# Patient Record
Sex: Male | Born: 1940 | Race: White | Hispanic: No | Marital: Married | Smoking: Former smoker
Health system: Southern US, Community
[De-identification: ages and names within clinical notes are randomized; demographics above are authoritative.]

## PROBLEM LIST (undated history)

## (undated) DIAGNOSIS — I10 Essential (primary) hypertension: Secondary | ICD-10-CM

## (undated) DIAGNOSIS — M1990 Unspecified osteoarthritis, unspecified site: Secondary | ICD-10-CM

## (undated) DIAGNOSIS — K219 Gastro-esophageal reflux disease without esophagitis: Secondary | ICD-10-CM

## (undated) DIAGNOSIS — G4730 Sleep apnea, unspecified: Secondary | ICD-10-CM

## (undated) HISTORY — PX: BILATERAL CARPAL TUNNEL RELEASE: SHX6508

## (undated) HISTORY — PX: COLONOSCOPY: SHX174

---

## 2002-06-26 ENCOUNTER — Encounter: Admission: RE | Admit: 2002-06-26 | Discharge: 2002-06-26 | Payer: Self-pay

## 2002-11-17 ENCOUNTER — Ambulatory Visit (HOSPITAL_BASED_OUTPATIENT_CLINIC_OR_DEPARTMENT_OTHER): Admission: RE | Admit: 2002-11-17 | Discharge: 2002-11-17 | Payer: Self-pay

## 2002-12-26 ENCOUNTER — Encounter: Admission: RE | Admit: 2002-12-26 | Discharge: 2002-12-26 | Payer: Self-pay

## 2014-10-14 DIAGNOSIS — Z1389 Encounter for screening for other disorder: Secondary | ICD-10-CM | POA: Diagnosis not present

## 2014-10-14 DIAGNOSIS — I10 Essential (primary) hypertension: Secondary | ICD-10-CM | POA: Diagnosis not present

## 2014-10-14 DIAGNOSIS — M109 Gout, unspecified: Secondary | ICD-10-CM | POA: Diagnosis not present

## 2014-10-14 DIAGNOSIS — N400 Enlarged prostate without lower urinary tract symptoms: Secondary | ICD-10-CM | POA: Diagnosis not present

## 2014-10-14 DIAGNOSIS — R7309 Other abnormal glucose: Secondary | ICD-10-CM | POA: Diagnosis not present

## 2014-10-14 DIAGNOSIS — Z79899 Other long term (current) drug therapy: Secondary | ICD-10-CM | POA: Diagnosis not present

## 2014-10-14 DIAGNOSIS — E782 Mixed hyperlipidemia: Secondary | ICD-10-CM | POA: Diagnosis not present

## 2015-05-14 DIAGNOSIS — M25561 Pain in right knee: Secondary | ICD-10-CM | POA: Diagnosis not present

## 2015-05-14 DIAGNOSIS — M1711 Unilateral primary osteoarthritis, right knee: Secondary | ICD-10-CM | POA: Diagnosis not present

## 2015-05-18 DIAGNOSIS — R7303 Prediabetes: Secondary | ICD-10-CM | POA: Diagnosis not present

## 2015-05-18 DIAGNOSIS — Z9181 History of falling: Secondary | ICD-10-CM | POA: Diagnosis not present

## 2015-05-18 DIAGNOSIS — M109 Gout, unspecified: Secondary | ICD-10-CM | POA: Diagnosis not present

## 2015-05-18 DIAGNOSIS — Z125 Encounter for screening for malignant neoplasm of prostate: Secondary | ICD-10-CM | POA: Diagnosis not present

## 2015-05-18 DIAGNOSIS — Z23 Encounter for immunization: Secondary | ICD-10-CM | POA: Diagnosis not present

## 2015-05-18 DIAGNOSIS — E782 Mixed hyperlipidemia: Secondary | ICD-10-CM | POA: Diagnosis not present

## 2015-05-18 DIAGNOSIS — Z139 Encounter for screening, unspecified: Secondary | ICD-10-CM | POA: Diagnosis not present

## 2015-05-18 DIAGNOSIS — I10 Essential (primary) hypertension: Secondary | ICD-10-CM | POA: Diagnosis not present

## 2015-05-18 DIAGNOSIS — M179 Osteoarthritis of knee, unspecified: Secondary | ICD-10-CM | POA: Diagnosis not present

## 2015-06-08 ENCOUNTER — Other Ambulatory Visit (HOSPITAL_COMMUNITY): Payer: Self-pay | Admitting: *Deleted

## 2015-06-08 NOTE — Patient Instructions (Addendum)
Albert Hart.  06/08/2015   Your procedure is scheduled MI:WOEHOZY 06-22-15  Report to [facility]  Entrance take [facility]  elevators to 3rd floor to  [place] at 1055 PM  Call this number if you have problems the morning of surgery [phone]   Remember: ONLY 1 PERSON MAY GO WITH YOU TO SHORT STAY TO GET  READY MORNING OF YOUR SURGERY.  Do not eat food or drink liquids :After Midnight.     Take these medicines the morning of surgery with A SIP OF WATER:  Carvedilol (Coreg)  DO NOT TAKE ANY DIABETIC MEDICATIONS DAY OF YOUR SURGERY!                               You may not have any metal on your body including hair pins and              piercings  Do not wear jewelry, make-up, lotions, powders or perfumes, deodorant             Do not wear nail polish.  Do not shave  48 hours prior to surgery.              Men may shave face and neck.   Do not bring valuables to the hospital. [place].  Contacts, dentures or bridgework may not be worn into surgery.  Leave suitcase in the car. After surgery it may be brought to your room.     Patients discharged the day of surgery will not be allowed to drive home.  Name and phone number of your driver:  Special Instructions: N/A              Please read over the following fact sheets you were given: _____________________________________________________________________             [facility] - Preparing for Surgery Before surgery, you can play an important role.  Because skin is not sterile, your skin needs to be as free of germs as possible.  You can reduce the number of germs on your skin by washing with CHG (chlorahexidine gluconate) soap before surgery.  CHG is an antiseptic cleaner which kills germs and bonds with the skin to continue killing germs even after washing. Please DO NOT use if you have an allergy to CHG or antibacterial soaps.  If your  skin becomes reddened/irritated stop using the CHG and inform your nurse when you arrive at Short Stay. Do not shave (including legs and underarms) for at least 48 hours prior to the first CHG shower.  You may shave your face/neck. Please follow these instructions carefully:  1.  Shower with CHG Soap the night before surgery and the  morning of Surgery.  2.  If you choose to wash your hair, wash your hair first as usual with your  normal  shampoo.  3.  After you shampoo, rinse your hair and body thoroughly to remove the  shampoo.                           4.  Use CHG as you would any other liquid soap.  You can apply chg directly  to the skin  and wash                       Gently with a scrungie or clean washcloth.  5.  Apply the CHG Soap to your body ONLY FROM THE NECK DOWN.   Do not use on face/ open                           Wound or open sores. Avoid contact with eyes, ears mouth and genitals (private parts).                       Wash face,  Genitals (private parts) with your normal soap.             6.  Wash thoroughly, paying special attention to the area where your surgery  will be performed.  7.  Thoroughly rinse your body with warm water from the neck down.  8.  DO NOT shower/wash with your normal soap after using and rinsing off  the CHG Soap.                9.  Pat yourself dry with a clean towel.            10.  Wear clean pajamas.            11.  Place clean sheets on your bed the night of your first shower and do not  sleep with pets. Day of Surgery : Do not apply any lotions/deodorants the morning of surgery.  Please wear clean clothes to the hospital/surgery center.  FAILURE TO FOLLOW THESE INSTRUCTIONS MAY RESULT IN THE CANCELLATION OF YOUR SURGERY PATIENT SIGNATURE_________________________________  NURSE SIGNATURE__________________________________  ________________________________________________________________________   Adam Phenix  An incentive spirometer  is a tool that can help keep your lungs clear and active. This tool measures how well you are filling your lungs with each breath. Taking long deep breaths may help reverse or decrease the chance of developing breathing (pulmonary) problems (especially infection) following:  A long period of time when you are unable to move or be active. BEFORE THE PROCEDURE   If the spirometer includes an indicator to show your best effort, your nurse or respiratory therapist will set it to a desired goal.  If possible, sit up straight or lean slightly forward. Try not to slouch.  Hold the incentive spirometer in an upright position. INSTRUCTIONS FOR USE   Sit on the edge of your bed if possible, or sit up as far as you can in bed or on a chair.  Hold the incentive spirometer in an upright position.  Breathe out normally.  Place the mouthpiece in your mouth and seal your lips tightly around it.  Breathe in slowly and as deeply as possible, raising the piston or the ball toward the top of the column.  Hold your breath for 3-5 seconds or for as long as possible. Allow the piston or ball to fall to the bottom of the column.  Remove the mouthpiece from your mouth and breathe out normally.  Rest for a few seconds and repeat Steps 1 through 7 at least 10 times every 1-2 hours when you are awake. Take your time and take a few normal breaths between deep breaths.  The spirometer may include an indicator to show your best effort. Use the indicator as a goal to work toward during each repetition.  After each set of 10  deep breaths, practice coughing to be sure your lungs are clear. If you have an incision (the cut made at the time of surgery), support your incision when coughing by placing a pillow or rolled up towels firmly against it. Once you are able to get out of bed, walk around indoors and cough well. You may stop using the incentive spirometer when instructed by your caregiver.  RISKS AND  COMPLICATIONS  Take your time so you do not get dizzy or light-headed.  If you are in pain, you may need to take or ask for pain medication before doing incentive spirometry. It is harder to take a deep breath if you are having pain. AFTER USE  Rest and breathe slowly and easily.  It can be helpful to keep track of a log of your progress. Your caregiver can provide you with a simple table to help with this. If you are using the spirometer at home, follow these instructions: [place] IF:   You are having difficultly using the spirometer.  You have trouble using the spirometer as often as instructed.  Your pain medication is not giving enough relief while using the spirometer.  You develop fever of 100.5 F (38.1 C) or higher. SEEK IMMEDIATE MEDICAL CARE IF:   You cough up bloody sputum that had not been present before.  You develop fever of 102 F (38.9 C) or greater.  You develop worsening pain at or near the incision site. MAKE SURE YOU:   Understand these instructions.  Will watch your condition.  Will get help right away if you are not doing well or get worse. Document Released: 12/11/2006 Document Revised: 10/23/2011 Document Reviewed: 02/11/2007 Exit[employer] Patient Information 2014 [employer], [place].   ________________________________________________________________________  WHAT IS A BLOOD TRANSFUSION? Blood Transfusion Information  A transfusion is the replacement of blood or some of its parts. Blood is made up of multiple cells which provide different functions.  Red blood cells carry oxygen and are used for blood loss replacement.  White blood cells fight against infection.  Platelets control bleeding.  Plasma helps clot blood.  Other blood products are available for specialized needs, such as hemophilia or other clotting disorders. BEFORE THE TRANSFUSION  Who gives blood for transfusions?   Healthy volunteers who are fully evaluated to make sure  their blood is safe. This is blood bank blood. Transfusion therapy is the safest it has ever been in the practice of medicine. Before blood is taken from a donor, a complete history is taken to make sure that person has no history of diseases nor engages in risky social behavior (examples are intravenous drug use or sexual activity with multiple partners). The donor's travel history is screened to minimize risk of transmitting infections, such as malaria. The donated blood is tested for signs of infectious diseases, such as HIV and hepatitis. The blood is then tested to be sure it is compatible with you in order to minimize the chance of a transfusion reaction. If you or a relative donates blood, this is often done in anticipation of surgery and is not appropriate for emergency situations. It takes many days to process the donated blood. RISKS AND COMPLICATIONS Although transfusion therapy is very safe and saves many lives, the main dangers of transfusion include:   Getting an infectious disease.  Developing a transfusion reaction. This is an allergic reaction to something in the blood you were given. Every precaution is taken to prevent this. The decision to have a blood transfusion  has been considered carefully by your caregiver before blood is given. Blood is not given unless the benefits outweigh the risks. AFTER THE TRANSFUSION  Right after receiving a blood transfusion, you will usually feel much better and more energetic. This is especially true if your red blood cells have gotten low (anemic). The transfusion raises the level of the red blood cells which carry oxygen, and this usually causes an energy increase.  The nurse administering the transfusion will monitor you carefully for complications. HOME CARE INSTRUCTIONS  No special instructions are needed after a transfusion. You may find your energy is better. Speak with your caregiver about any limitations on activity for underlying diseases  you may have. SEEK MEDICAL CARE IF:   Your condition is not improving after your transfusion.  You develop redness or irritation at the intravenous (IV) site. SEEK IMMEDIATE MEDICAL CARE IF:  Any of the following symptoms occur over the next 12 hours:  Shaking chills.  You have a temperature by mouth above 102 F (38.9 C), not controlled by medicine.  Chest, back, or muscle pain.  People around you feel you are not acting correctly or are confused.  Shortness of breath or difficulty breathing.  Dizziness and fainting.  You get a rash or develop hives.  You have a decrease in urine output.  Your urine turns a dark color or changes to pink, red, or brown. Any of the following symptoms occur over the next 10 days:  You have a temperature by mouth above 102 F (38.9 C), not controlled by medicine.  Shortness of breath.  Weakness after normal activity.  The white part of the eye turns yellow (jaundice).  You have a decrease in the amount of urine or are urinating less often.  Your urine turns a dark color or changes to pink, red, or brown. Document Released: 07/28/2000 Document Revised: 10/23/2011 Document Reviewed: 03/16/2008 [facility] Patient Information 2014 [place], [place].  _______________________________________________________________________

## 2015-06-08 NOTE — Progress Notes (Signed)
Medical clearance note nathan conroy pac on chart for 06-22-15 surgery  ekg 08-17-14 ekg dr Lisbeth Ply on chart

## 2015-06-10 ENCOUNTER — Encounter (HOSPITAL_COMMUNITY): Payer: Self-pay

## 2015-06-10 ENCOUNTER — Encounter (HOSPITAL_COMMUNITY)
Admission: RE | Admit: 2015-06-10 | Discharge: 2015-06-10 | Disposition: A | Discharge status: Home / Self Care | Payer: Commercial Managed Care - HMO | Source: Ambulatory Visit | Attending: Orthopedic Surgery | Admitting: Orthopedic Surgery

## 2015-06-10 DIAGNOSIS — M179 Osteoarthritis of knee, unspecified: Secondary | ICD-10-CM | POA: Diagnosis not present

## 2015-06-10 DIAGNOSIS — Z01818 Encounter for other preprocedural examination: Secondary | ICD-10-CM | POA: Diagnosis not present

## 2015-06-10 HISTORY — DX: Gastro-esophageal reflux disease without esophagitis: K21.9

## 2015-06-10 HISTORY — DX: Essential (primary) hypertension: I10

## 2015-06-10 HISTORY — DX: Sleep apnea, unspecified: G47.30

## 2015-06-10 HISTORY — DX: Unspecified osteoarthritis, unspecified site: M19.90

## 2015-06-10 LAB — TYPE AND SCREEN
ABO/RH(D): A NEG
Antibody Screen: NEGATIVE

## 2015-06-10 LAB — ABO/RH: ABO/RH(D): A NEG

## 2015-06-10 LAB — URINALYSIS, ROUTINE W REFLEX MICROSCOPIC
Bilirubin Urine: NEGATIVE
GLUCOSE, UA: NEGATIVE mg/dL
HGB URINE DIPSTICK: NEGATIVE
Ketones, ur: NEGATIVE mg/dL
Nitrite: NEGATIVE
Protein, ur: NEGATIVE mg/dL
SPECIFIC GRAVITY, URINE: 1.018 (ref 1.005–1.030)
UROBILINOGEN UA: 1 mg/dL (ref 0.0–1.0)
pH: 6 (ref 5.0–8.0)

## 2015-06-10 LAB — URINE MICROSCOPIC-ADD ON

## 2015-06-10 LAB — CBC
HCT: 43.1 % (ref 39.0–52.0)
Hemoglobin: 14.4 g/dL (ref 13.0–17.0)
MCH: 33 pg (ref 26.0–34.0)
MCHC: 33.4 g/dL (ref 30.0–36.0)
MCV: 98.6 fL (ref 78.0–100.0)
PLATELETS: 183 10*3/uL (ref 150–400)
RBC: 4.37 MIL/uL (ref 4.22–5.81)
RDW: 12.9 % (ref 11.5–15.5)
WBC: 7.2 10*3/uL (ref 4.0–10.5)

## 2015-06-10 LAB — BASIC METABOLIC PANEL
Anion gap: 8 (ref 5–15)
BUN: 18 mg/dL (ref 6–20)
CHLORIDE: 104 mmol/L (ref 101–111)
CO2: 28 mmol/L (ref 22–32)
CREATININE: 0.89 mg/dL (ref 0.61–1.24)
Calcium: 9.4 mg/dL (ref 8.9–10.3)
Glucose, Bld: 132 mg/dL — ABNORMAL HIGH (ref 65–99)
POTASSIUM: 4 mmol/L (ref 3.5–5.1)
SODIUM: 140 mmol/L (ref 135–145)

## 2015-06-10 LAB — PROTIME-INR
INR: 1.01 (ref 0.00–1.49)
PROTHROMBIN TIME: 13.5 s (ref 11.6–15.2)

## 2015-06-10 LAB — SURGICAL PCR SCREEN
MRSA, PCR: NEGATIVE
Staphylococcus aureus: NEGATIVE

## 2015-06-10 LAB — APTT: APTT: 24 s (ref 24–37)

## 2015-06-10 NOTE — H&P (Signed)
TOTAL KNEE ADMISSION H&P  Patient is being admitted for right total knee arthroplasty.  Subjective:  Chief Complaint:      Right knee primary OA / pain  HPI: Albert Hart., 74 y.o. male, has a history of pain and functional disability in the right knee due to arthritis and has failed non-surgical conservative treatments for greater than 12 weeks to include NSAID's and/or analgesics, corticosteriod injections, viscosupplementation injections and activity modification.  Onset of symptoms was gradual, starting 3+ years ago with gradually worsening course since that time. The patient noted no past surgery on the right knee(s).  Patient currently rates pain in the right knee(s) at 6 out of 10 with activity. Patient has worsening of pain with activity and weight bearing, pain that interferes with activities of daily living, pain with passive range of motion, crepitus and joint swelling.  Patient has evidence of periarticular osteophytes and joint space narrowing by imaging studies.  There is no active infection.  Risks, benefits and expectations were discussed with the patient.  Risks including but not limited to the risk of anesthesia, blood clots, nerve damage, blood vessel damage, failure of the prosthesis, infection and up to and including death.  Patient understand the risks, benefits and expectations and wishes to proceed with surgery.   PCP: Albert Sake, MD  D/C Plans:      Home with HHPT  Post-op Meds:       No Rx given  Tranexamic Acid:      To be given - IV  Decadron:      Is to be given  FYI:     ASA post-op  Norco post-op  CPAP as needed  ?? - Observation admission ??    Past Medical History  Diagnosis Date  . Hypertension   . GERD (gastroesophageal reflux disease)   . Arthritis   . Sleep apnea     uses CPAP    Past Surgical History  Procedure Laterality Date  . Colonoscopy    . Bilateral carpal tunnel release      No prescriptions prior to admission   No Known  Allergies   Social History  Substance Use Topics  . Smoking status: Former Smoker -- 40 years    Types: Pipe, Cigars    Quit date: 06/03/2000  . Smokeless tobacco: Not on file  . Alcohol Use: Yes     Comment: beer occassionally    No family history on file.   Review of Systems  Constitutional: Negative.   HENT: Negative.   Eyes: Negative.   Respiratory: Negative.   Cardiovascular: Negative.   Gastrointestinal: Positive for heartburn.  Genitourinary: Negative.   Musculoskeletal: Positive for joint pain.  Skin: Negative.   Neurological: Negative.   Endo/Heme/Allergies: Negative.   Psychiatric/Behavioral: Negative.     Objective:  Physical Exam  Constitutional: He is oriented to person, place, and time. He appears well-developed and well-nourished.  HENT:  Head: Normocephalic.  Mouth/Throat: He has dentures.  Eyes: Pupils are equal, round, and reactive to light.  Neck: Neck supple. No JVD present. No tracheal deviation present. No thyromegaly present.  Cardiovascular: Normal rate, regular rhythm, normal heart sounds and intact distal pulses.   Respiratory: Effort normal and breath sounds normal. No stridor. No respiratory distress. He has no wheezes.  GI: Soft. There is no tenderness. There is no guarding.  Musculoskeletal:       Right knee: He exhibits decreased range of motion, swelling and bony tenderness. He exhibits no ecchymosis,  no deformity, no laceration and no erythema. Tenderness found.  Lymphadenopathy:    He has no cervical adenopathy.  Neurological: He is alert and oriented to person, place, and time.  Skin: Skin is warm and dry.  Psychiatric: He has a normal mood and affect.      Imaging Review Plain radiographs demonstrate severe degenerative joint disease of the right knee(s).  The bone quality appears to be good for age and reported activity level.  Assessment/Plan:  End stage arthritis, right knee   The patient history, physical examination,  clinical judgment of the provider and imaging studies are consistent with end stage degenerative joint disease of the right knee(s) and total knee arthroplasty is deemed medically necessary. The treatment options including medical management, injection therapy arthroscopy and arthroplasty were discussed at length. The risks and benefits of total knee arthroplasty were presented and reviewed. The risks due to aseptic loosening, infection, stiffness, patella tracking problems, thromboembolic complications and other imponderables were discussed. The patient acknowledged the explanation, agreed to proceed with the plan and consent was signed. Patient is being admitted for inpatient treatment for surgery, pain control, PT, OT, prophylactic antibiotics, VTE prophylaxis, progressive ambulation and ADL's and discharge planning. The patient is planning to be discharged home with home health services.     [place] Charley Miske   PA-C  06/10/2015, 9:52 AM

## 2015-06-21 MED ORDER — DEXTROSE 5 % IV SOLN
3.0000 g | INTRAVENOUS | Status: AC
  Administered 2015-06-22: 3 g via INTRAVENOUS
  Filled 2015-06-21: qty 3000

## 2015-06-22 ENCOUNTER — Inpatient Hospital Stay (HOSPITAL_COMMUNITY): Payer: Commercial Managed Care - HMO | Admitting: Anesthesiology

## 2015-06-22 ENCOUNTER — Inpatient Hospital Stay (HOSPITAL_COMMUNITY)
Admission: RE | Admit: 2015-06-22 | Discharge: 2015-06-23 | DRG: 470 | Disposition: A | Discharge status: Home Health | Payer: Commercial Managed Care - HMO | Source: Ambulatory Visit | Attending: Orthopedic Surgery | Admitting: Orthopedic Surgery

## 2015-06-22 ENCOUNTER — Encounter (HOSPITAL_COMMUNITY)
Admission: RE | Disposition: A | Discharge status: Home Health | Payer: Self-pay | Source: Ambulatory Visit | Attending: Orthopedic Surgery

## 2015-06-22 ENCOUNTER — Encounter (HOSPITAL_COMMUNITY): Payer: Self-pay | Admitting: *Deleted

## 2015-06-22 DIAGNOSIS — M1711 Unilateral primary osteoarthritis, right knee: Secondary | ICD-10-CM | POA: Diagnosis not present

## 2015-06-22 DIAGNOSIS — Z6835 Body mass index (BMI) 35.0-35.9, adult: Secondary | ICD-10-CM

## 2015-06-22 DIAGNOSIS — Z96651 Presence of right artificial knee joint: Secondary | ICD-10-CM

## 2015-06-22 DIAGNOSIS — Z87891 Personal history of nicotine dependence: Secondary | ICD-10-CM | POA: Diagnosis not present

## 2015-06-22 DIAGNOSIS — K219 Gastro-esophageal reflux disease without esophagitis: Secondary | ICD-10-CM | POA: Diagnosis not present

## 2015-06-22 DIAGNOSIS — E669 Obesity, unspecified: Secondary | ICD-10-CM | POA: Diagnosis present

## 2015-06-22 DIAGNOSIS — M25561 Pain in right knee: Secondary | ICD-10-CM | POA: Diagnosis present

## 2015-06-22 DIAGNOSIS — M179 Osteoarthritis of knee, unspecified: Secondary | ICD-10-CM | POA: Diagnosis not present

## 2015-06-22 DIAGNOSIS — M659 Synovitis and tenosynovitis, unspecified: Secondary | ICD-10-CM | POA: Diagnosis not present

## 2015-06-22 DIAGNOSIS — Z01812 Encounter for preprocedural laboratory examination: Secondary | ICD-10-CM

## 2015-06-22 DIAGNOSIS — I10 Essential (primary) hypertension: Secondary | ICD-10-CM | POA: Diagnosis not present

## 2015-06-22 DIAGNOSIS — G4730 Sleep apnea, unspecified: Secondary | ICD-10-CM | POA: Diagnosis present

## 2015-06-22 DIAGNOSIS — Z96659 Presence of unspecified artificial knee joint: Secondary | ICD-10-CM

## 2015-06-22 HISTORY — PX: TOTAL KNEE ARTHROPLASTY: SHX125

## 2015-06-22 SURGERY — ARTHROPLASTY, KNEE, TOTAL
Anesthesia: Spinal | Site: Knee | Laterality: Right

## 2015-06-22 MED ORDER — FENTANYL CITRATE (PF) 100 MCG/2ML IJ SOLN
INTRAMUSCULAR | Status: AC
  Filled 2015-06-22: qty 4

## 2015-06-22 MED ORDER — DOXAZOSIN MESYLATE 2 MG PO TABS
2.0000 mg | ORAL_TABLET | Freq: Every day | ORAL | Status: DC
  Administered 2015-06-22: 2 mg via ORAL
  Filled 2015-06-22 (×2): qty 1

## 2015-06-22 MED ORDER — MENTHOL 3 MG MT LOZG: 1.0000 | LOZENGE | OROMUCOSAL | Status: DC | PRN

## 2015-06-22 MED ORDER — LIDOCAINE HCL (CARDIAC) 20 MG/ML IV SOLN
INTRAVENOUS | Status: AC
  Filled 2015-06-22: qty 5

## 2015-06-22 MED ORDER — PROPOFOL 10 MG/ML IV BOLUS
INTRAVENOUS | Status: AC
  Filled 2015-06-22: qty 20

## 2015-06-22 MED ORDER — HYDROMORPHONE HCL 1 MG/ML IJ SOLN
0.5000 mg | INTRAMUSCULAR | Status: DC | PRN
  Administered 2015-06-22 – 2015-06-23 (×4): 1 mg via INTRAVENOUS
  Filled 2015-06-22 (×4): qty 1

## 2015-06-22 MED ORDER — ALLOPURINOL 300 MG PO TABS
300.0000 mg | ORAL_TABLET | Freq: Every day | ORAL | Status: DC
  Administered 2015-06-22: 300 mg via ORAL
  Filled 2015-06-22 (×2): qty 1

## 2015-06-22 MED ORDER — ONDANSETRON HCL 4 MG/2ML IJ SOLN: 4.0000 mg | Freq: Four times a day (QID) | INTRAMUSCULAR | Status: DC | PRN

## 2015-06-22 MED ORDER — MIDAZOLAM HCL 2 MG/2ML IJ SOLN
INTRAMUSCULAR | Status: AC
  Filled 2015-06-22: qty 4

## 2015-06-22 MED ORDER — FENTANYL CITRATE (PF) 100 MCG/2ML IJ SOLN
INTRAMUSCULAR | Status: DC | PRN
  Administered 2015-06-22: 50 ug via INTRAVENOUS

## 2015-06-22 MED ORDER — PROPOFOL 500 MG/50ML IV EMUL
INTRAVENOUS | Status: DC | PRN
  Administered 2015-06-22: 75 ug/kg/min via INTRAVENOUS
  Administered 2015-06-22: 25 ug/kg/min via INTRAVENOUS

## 2015-06-22 MED ORDER — CELECOXIB 200 MG PO CAPS
200.0000 mg | ORAL_CAPSULE | Freq: Two times a day (BID) | ORAL | Status: DC
  Administered 2015-06-22 – 2015-06-23 (×2): 200 mg via ORAL
  Filled 2015-06-22 (×3): qty 1

## 2015-06-22 MED ORDER — BUPIVACAINE-EPINEPHRINE (PF) 0.25% -1:200000 IJ SOLN
INTRAMUSCULAR | Status: AC
  Filled 2015-06-22: qty 30

## 2015-06-22 MED ORDER — METOCLOPRAMIDE HCL 5 MG/ML IJ SOLN: 5.0000 mg | Freq: Three times a day (TID) | INTRAMUSCULAR | Status: DC | PRN

## 2015-06-22 MED ORDER — DEXAMETHASONE SODIUM PHOSPHATE 10 MG/ML IJ SOLN
10.0000 mg | Freq: Once | INTRAMUSCULAR | Status: AC
  Administered 2015-06-22: 10 mg via INTRAVENOUS

## 2015-06-22 MED ORDER — DIPHENHYDRAMINE HCL 25 MG PO CAPS: 25.0000 mg | ORAL_CAPSULE | Freq: Four times a day (QID) | ORAL | Status: DC | PRN

## 2015-06-22 MED ORDER — DEXAMETHASONE SODIUM PHOSPHATE 10 MG/ML IJ SOLN
INTRAMUSCULAR | Status: AC
  Filled 2015-06-22: qty 1

## 2015-06-22 MED ORDER — MIDAZOLAM HCL 5 MG/5ML IJ SOLN
INTRAMUSCULAR | Status: DC | PRN
  Administered 2015-06-22 (×2): 1 mg via INTRAVENOUS

## 2015-06-22 MED ORDER — LIDOCAINE HCL (CARDIAC) 20 MG/ML IV SOLN
INTRAVENOUS | Status: DC | PRN
  Administered 2015-06-22: 100 mg via INTRAVENOUS

## 2015-06-22 MED ORDER — DEXAMETHASONE SODIUM PHOSPHATE 10 MG/ML IJ SOLN
10.0000 mg | Freq: Once | INTRAMUSCULAR | Status: AC
  Administered 2015-06-23: 10 mg via INTRAVENOUS
  Filled 2015-06-22: qty 1

## 2015-06-22 MED ORDER — METHOCARBAMOL 500 MG PO TABS
500.0000 mg | ORAL_TABLET | Freq: Four times a day (QID) | ORAL | Status: DC | PRN
  Administered 2015-06-23 (×2): 500 mg via ORAL
  Filled 2015-06-22 (×2): qty 1

## 2015-06-22 MED ORDER — TRANEXAMIC ACID 1000 MG/10ML IV SOLN
1000.0000 mg | Freq: Once | INTRAVENOUS | Status: AC
  Administered 2015-06-22: 1000 mg via INTRAVENOUS
  Filled 2015-06-22: qty 10

## 2015-06-22 MED ORDER — LACTATED RINGERS IV SOLN
INTRAVENOUS | Status: DC
  Administered 2015-06-22: 1000 mL via INTRAVENOUS

## 2015-06-22 MED ORDER — SODIUM CHLORIDE 0.9 % IJ SOLN
INTRAMUSCULAR | Status: AC
  Filled 2015-06-22: qty 50

## 2015-06-22 MED ORDER — CARVEDILOL 6.25 MG PO TABS
6.2500 mg | ORAL_TABLET | Freq: Two times a day (BID) | ORAL | Status: DC
  Administered 2015-06-22 – 2015-06-23 (×2): 6.25 mg via ORAL
  Filled 2015-06-22 (×4): qty 1

## 2015-06-22 MED ORDER — SODIUM CHLORIDE 0.9 % IV SOLN
INTRAVENOUS | Status: DC
  Administered 2015-06-22: 19:00:00 via INTRAVENOUS
  Filled 2015-06-22 (×4): qty 1000

## 2015-06-22 MED ORDER — BUPIVACAINE IN DEXTROSE 0.75-8.25 % IT SOLN
INTRATHECAL | Status: DC | PRN
  Administered 2015-06-22: 2 mL via INTRATHECAL

## 2015-06-22 MED ORDER — FERROUS SULFATE 325 (65 FE) MG PO TABS
325.0000 mg | ORAL_TABLET | Freq: Three times a day (TID) | ORAL | Status: DC
  Administered 2015-06-22 – 2015-06-23 (×3): 325 mg via ORAL
  Filled 2015-06-22 (×5): qty 1

## 2015-06-22 MED ORDER — BUPIVACAINE-EPINEPHRINE (PF) 0.25% -1:200000 IJ SOLN
INTRAMUSCULAR | Status: DC | PRN
  Administered 2015-06-22: 30 mL

## 2015-06-22 MED ORDER — CHLORHEXIDINE GLUCONATE 4 % EX LIQD: 60.0000 mL | Freq: Once | CUTANEOUS | Status: DC

## 2015-06-22 MED ORDER — METOCLOPRAMIDE HCL 10 MG PO TABS: 5.0000 mg | ORAL_TABLET | Freq: Three times a day (TID) | ORAL | Status: DC | PRN

## 2015-06-22 MED ORDER — ASPIRIN EC 325 MG PO TBEC
325.0000 mg | DELAYED_RELEASE_TABLET | Freq: Two times a day (BID) | ORAL | Status: DC
  Administered 2015-06-23: 325 mg via ORAL
  Filled 2015-06-22 (×3): qty 1

## 2015-06-22 MED ORDER — HYDROCHLOROTHIAZIDE 12.5 MG PO CAPS
12.5000 mg | ORAL_CAPSULE | Freq: Every day | ORAL | Status: DC
  Administered 2015-06-22: 12.5 mg via ORAL
  Filled 2015-06-22 (×3): qty 1

## 2015-06-22 MED ORDER — KETOROLAC TROMETHAMINE 30 MG/ML IJ SOLN
INTRAMUSCULAR | Status: AC
  Filled 2015-06-22: qty 1

## 2015-06-22 MED ORDER — SODIUM CHLORIDE 0.9 % IR SOLN
Status: DC | PRN
  Administered 2015-06-22: 1000 mL

## 2015-06-22 MED ORDER — LOSARTAN POTASSIUM 50 MG PO TABS
100.0000 mg | ORAL_TABLET | Freq: Every day | ORAL | Status: DC
Start: 2015-06-22 — End: 2015-06-23
  Administered 2015-06-22: 100 mg via ORAL
  Filled 2015-06-22 (×3): qty 2

## 2015-06-22 MED ORDER — ALUM & MAG HYDROXIDE-SIMETH 200-200-20 MG/5ML PO SUSP: 30.0000 mL | ORAL | Status: DC | PRN

## 2015-06-22 MED ORDER — 0.9 % SODIUM CHLORIDE (POUR BTL) OPTIME
TOPICAL | Status: DC | PRN
Start: 2015-06-22 — End: 2015-06-22
  Administered 2015-06-22: 1000 mL

## 2015-06-22 MED ORDER — BISACODYL 10 MG RE SUPP: 10.0000 mg | Freq: Every day | RECTAL | Status: DC | PRN

## 2015-06-22 MED ORDER — DOCUSATE SODIUM 100 MG PO CAPS
100.0000 mg | ORAL_CAPSULE | Freq: Two times a day (BID) | ORAL | Status: DC
  Administered 2015-06-22 – 2015-06-23 (×2): 100 mg via ORAL

## 2015-06-22 MED ORDER — PANTOPRAZOLE SODIUM 40 MG PO TBEC
40.0000 mg | DELAYED_RELEASE_TABLET | Freq: Every day | ORAL | Status: DC
  Administered 2015-06-22: 40 mg via ORAL
  Filled 2015-06-22 (×2): qty 1

## 2015-06-22 MED ORDER — ONDANSETRON HCL 4 MG PO TABS: 4.0000 mg | ORAL_TABLET | Freq: Four times a day (QID) | ORAL | Status: DC | PRN

## 2015-06-22 MED ORDER — HYDROCODONE-ACETAMINOPHEN 7.5-325 MG PO TABS
1.0000 | ORAL_TABLET | ORAL | Status: DC
  Administered 2015-06-22: 2 via ORAL
  Administered 2015-06-22: 1 via ORAL
  Administered 2015-06-23 (×4): 2 via ORAL
  Filled 2015-06-22 (×6): qty 2

## 2015-06-22 MED ORDER — POLYETHYLENE GLYCOL 3350 17 G PO PACK
17.0000 g | PACK | Freq: Two times a day (BID) | ORAL | Status: DC
  Administered 2015-06-23: 17 g via ORAL

## 2015-06-22 MED ORDER — OMEPRAZOLE MAGNESIUM 20 MG PO TBEC: 20.0000 mg | DELAYED_RELEASE_TABLET | Freq: Every day | ORAL | Status: DC

## 2015-06-22 MED ORDER — MAGNESIUM CITRATE PO SOLN: 1.0000 | Freq: Once | ORAL | Status: DC | PRN

## 2015-06-22 MED ORDER — SODIUM CHLORIDE 0.9 % IJ SOLN
INTRAMUSCULAR | Status: DC | PRN
  Administered 2015-06-22: 50 mL

## 2015-06-22 MED ORDER — METHOCARBAMOL 1000 MG/10ML IJ SOLN
500.0000 mg | Freq: Four times a day (QID) | INTRAMUSCULAR | Status: DC | PRN
  Filled 2015-06-22: qty 5

## 2015-06-22 MED ORDER — CEFAZOLIN SODIUM-DEXTROSE 2-3 GM-% IV SOLR
2.0000 g | Freq: Four times a day (QID) | INTRAVENOUS | Status: AC
  Administered 2015-06-22 – 2015-06-23 (×2): 2 g via INTRAVENOUS
  Filled 2015-06-22 (×2): qty 50

## 2015-06-22 MED ORDER — LOSARTAN POTASSIUM-HCTZ 100-12.5 MG PO TABS: 1.0000 | ORAL_TABLET | Freq: Every day | ORAL | Status: DC

## 2015-06-22 MED ORDER — PHENOL 1.4 % MT LIQD: 1.0000 | OROMUCOSAL | Status: DC | PRN

## 2015-06-22 MED ORDER — SIMVASTATIN 40 MG PO TABS
40.0000 mg | ORAL_TABLET | Freq: Every day | ORAL | Status: DC
  Administered 2015-06-22: 40 mg via ORAL
  Filled 2015-06-22 (×2): qty 1

## 2015-06-22 MED ORDER — EPHEDRINE SULFATE 50 MG/ML IJ SOLN
INTRAMUSCULAR | Status: DC | PRN
  Administered 2015-06-22 (×2): 5 mg via INTRAVENOUS

## 2015-06-22 MED ORDER — ONDANSETRON HCL 4 MG/2ML IJ SOLN
INTRAMUSCULAR | Status: DC | PRN
  Administered 2015-06-22: 4 mg via INTRAVENOUS

## 2015-06-22 MED ORDER — KETOROLAC TROMETHAMINE 30 MG/ML IJ SOLN
INTRAMUSCULAR | Status: DC | PRN
  Administered 2015-06-22: 30 mg via INTRAVENOUS

## 2015-06-22 SURGICAL SUPPLY — 45 items
BAG DECANTER FOR FLEXI CONT (MISCELLANEOUS) IMPLANT
BAG SPEC THK2 15X12 ZIP CLS (MISCELLANEOUS)
BAG ZIPLOCK 12X15 (MISCELLANEOUS) IMPLANT
BANDAGE ELASTIC 6 VELCRO ST LF (GAUZE/BANDAGES/DRESSINGS) ×3 IMPLANT
BLADE SAW SGTL 13.0X1.19X90.0M (BLADE) ×3 IMPLANT
BOWL SMART MIX CTS (DISPOSABLE) ×3 IMPLANT
CAPT KNEE TOTAL 3 ATTUNE ×2 IMPLANT
CEMENT HV SMART SET (Cement) ×4 IMPLANT
CLOTH BEACON ORANGE TIMEOUT ST (SAFETY) ×3 IMPLANT
CUFF TOURN SGL QUICK 34 (TOURNIQUET CUFF) ×3
CUFF TRNQT CYL 34X4X40X1 (TOURNIQUET CUFF) ×1 IMPLANT
DECANTER SPIKE VIAL GLASS SM (MISCELLANEOUS) ×3 IMPLANT
DRAPE U-SHAPE 47X51 STRL (DRAPES) ×3 IMPLANT
DRSG AQUACEL AG ADV 3.5X10 (GAUZE/BANDAGES/DRESSINGS) ×3 IMPLANT
DURAPREP 26ML APPLICATOR (WOUND CARE) ×6 IMPLANT
ELECT REM PT RETURN 9FT ADLT (ELECTROSURGICAL) ×3
ELECTRODE REM PT RTRN 9FT ADLT (ELECTROSURGICAL) ×1 IMPLANT
GLOVE BIOGEL M 7.0 STRL (GLOVE) IMPLANT
GLOVE BIOGEL M STRL SZ7.5 (GLOVE) IMPLANT
GLOVE BIOGEL PI IND STRL 7.5 (GLOVE) ×1 IMPLANT
GLOVE BIOGEL PI IND STRL 8.5 (GLOVE) ×1 IMPLANT
GLOVE BIOGEL PI INDICATOR 7.5 (GLOVE) ×2
GLOVE BIOGEL PI INDICATOR 8.5 (GLOVE) ×2
GLOVE ECLIPSE 8.0 STRL XLNG CF (GLOVE) ×3 IMPLANT
GLOVE ORTHO TXT STRL SZ7.5 (GLOVE) ×6 IMPLANT
GOWN STRL REUS W/TWL LRG LVL3 (GOWN DISPOSABLE) ×3 IMPLANT
GOWN STRL REUS W/TWL XL LVL3 (GOWN DISPOSABLE) ×3 IMPLANT
HANDPIECE INTERPULSE COAX TIP (DISPOSABLE) ×3
LIQUID BAND (GAUZE/BANDAGES/DRESSINGS) ×3 IMPLANT
MANIFOLD NEPTUNE II (INSTRUMENTS) ×3 IMPLANT
PACK TOTAL KNEE CUSTOM (KITS) ×3 IMPLANT
POSITIONER SURGICAL ARM (MISCELLANEOUS) ×3 IMPLANT
SET HNDPC FAN SPRY TIP SCT (DISPOSABLE) ×1 IMPLANT
SET PAD KNEE POSITIONER (MISCELLANEOUS) ×3 IMPLANT
SUCTION FRAZIER 12FR DISP (SUCTIONS) ×3 IMPLANT
SUT MNCRL AB 4-0 PS2 18 (SUTURE) ×3 IMPLANT
SUT VIC AB 1 CT1 36 (SUTURE) ×3 IMPLANT
SUT VIC AB 2-0 CT1 27 (SUTURE) ×9
SUT VIC AB 2-0 CT1 TAPERPNT 27 (SUTURE) ×3 IMPLANT
SUT VLOC 180 0 24IN GS25 (SUTURE) ×3 IMPLANT
SYR 50ML LL SCALE MARK (SYRINGE) ×3 IMPLANT
TRAY FOLEY W/METER SILVER 16FR (SET/KITS/TRAYS/PACK) ×3 IMPLANT
WATER STERILE IRR 1500ML POUR (IV SOLUTION) ×3 IMPLANT
WRAP KNEE MAXI GEL POST OP (GAUZE/BANDAGES/DRESSINGS) ×3 IMPLANT
YANKAUER SUCT BULB TIP 10FT TU (MISCELLANEOUS) ×3 IMPLANT

## 2015-06-22 NOTE — Transfer of Care (Signed)
Immediate Anesthesia Transfer of Care Note  Patient: Albert Hart.  Procedure(s) Performed: Procedure(s): TOTAL RIGHT KNEE ARTHROPLASTY (Right)  Patient Location: PACU  Anesthesia Type:MAC and Spinal  Level of Consciousness: awake, alert , oriented and patient cooperative  Airway & Oxygen Therapy: Patient Spontanous Breathing and Patient connected to face mask oxygen  Post-op Assessment: Report given to RN and Post -op Vital signs reviewed and stable  Post vital signs: Reviewed and stable  Last Vitals:  Filed Vitals:   06/22/15 1056  BP: 168/89  Pulse: 65  Temp: 37.3 C  Resp: 18    Complications: No apparent anesthesia complications

## 2015-06-22 NOTE — Anesthesia Preprocedure Evaluation (Addendum)
Anesthesia Evaluation  Patient identified by MRN, date of birth, ID band Patient awake    Reviewed: Allergy & Precautions, H&P , NPO status , Patient's Chart, lab work & pertinent test results, reviewed documented beta blocker date and time   Airway Mallampati: II  TM Distance: >3 FB Neck ROM: full    Dental  (+) Dental Advisory Given, Edentulous Upper   Pulmonary sleep apnea and Continuous Positive Airway Pressure Ventilation , former smoker,    Pulmonary exam normal breath sounds clear to auscultation       Cardiovascular hypertension, Pt. on medications and Pt. on home beta blockers Normal cardiovascular exam Rhythm:regular Rate:Normal     Neuro/Psych negative neurological ROS  negative psych ROS   GI/Hepatic negative GI ROS, Neg liver ROS,   Endo/Other  negative endocrine ROS  Renal/GU negative Renal ROS  negative genitourinary   Musculoskeletal   Abdominal (+) + obese,   Peds  Hematology negative hematology ROS (+)   Anesthesia Other Findings   Reproductive/Obstetrics negative OB ROS                           Anesthesia Physical Anesthesia Plan  ASA: III  Anesthesia Plan: Spinal   Post-op Pain Management:    Induction:   Airway Management Planned:   Additional Equipment:   Intra-op Plan:   Post-operative Plan:   Informed Consent: I have reviewed the patients History and Physical, chart, labs and discussed the procedure including the risks, benefits and alternatives for the proposed anesthesia with the patient or authorized representative who has indicated his/her understanding and acceptance.   Dental Advisory Given  Plan Discussed with: CRNA and Surgeon  Anesthesia Plan Comments:         Anesthesia Quick Evaluation

## 2015-06-22 NOTE — Progress Notes (Signed)
Pt refuses to wear CPAP. Pt encouraged to call RT if pt changed mind. No distress noted.

## 2015-06-22 NOTE — Anesthesia Procedure Notes (Addendum)
Procedure Name: MAC Date/Time: 06/22/2015 12:34 PM Performed by: Carleene Cooper A Pre-anesthesia Checklist: Patient identified, Timeout performed, Emergency Drugs available, Suction available and Patient being monitored Patient Re-evaluated:Patient Re-evaluated prior to inductionOxygen Delivery Method: Simple face mask Dental Injury: Teeth and Oropharynx as per pre-operative assessment    Spinal Patient location during procedure: OR Start time: 06/22/2015 12:40 PM End time: 06/22/2015 12:46 PM Staffing Resident/CRNA: Tinie Mcgloin A Performed by: resident/CRNA  Preanesthetic Checklist Completed: patient identified, site marked, surgical consent, pre-op evaluation, timeout performed, IV checked, risks and benefits discussed and monitors and equipment checked Spinal Block Patient position: sitting Prep: Betadine Patient monitoring: heart rate, continuous pulse ox and blood pressure Approach: midline Location: L2-3 Injection technique: single-shot Needle Needle type: Spinocan  Needle gauge: 22 G Needle length: 9 cm Assessment Sensory level: T4 Additional Notes Pt placed in sitting position for spinal. Pt tolerated well. One attempt by CRNA. - heme, + CSF. Spinal kit expiration date- 2016-12-11

## 2015-06-22 NOTE — Op Note (Signed)
NAME:  Albert Hart.                      MEDICAL RECORD NO.:  [id number]                             FACILITY:  [facility]      PHYSICIAN:  Pietro Cassis. Alvan Dame, M.D.  DATE OF BIRTH:  1941-04-09      DATE OF PROCEDURE:  06/22/2015                                     OPERATIVE REPORT         PREOPERATIVE DIAGNOSIS:  Right knee osteoarthritis.      POSTOPERATIVE DIAGNOSIS:  Right knee osteoarthritis.      FINDINGS:  The patient was noted to have complete loss of cartilage and   bone-on-bone arthritis with associated osteophytes in the medial and patellofemoral compartments of   the knee with a significant synovitis and associated effusion.      PROCEDURE:  Right total knee replacement.      COMPONENTS USED:  DePuy Attune rotating platform posterior stabilized knee   system, a size 7 standardfemur, 7 tibia, size 8 mm PS AOX insert, and 41 anatomic patellar   button.      SURGEON:  Pietro Cassis. Alvan Dame, M.D.      ASSISTANT:  Danae Orleans, PA-C.      ANESTHESIA:  Spinal.      SPECIMENS:  None.      COMPLICATION:  None.      DRAINS:  None.  EBL: <100cc      TOURNIQUET TIME:   Total Tourniquet Time Documented: Thigh (Right) - 34 minutes Total: Thigh (Right) - 34 minutes  .      The patient was stable to the recovery room.      INDICATION FOR PROCEDURE:  Albert Hart. is a 74 y.o. male patient of   mine.  The patient had been seen, evaluated, and treated conservatively in the   office with medication, activity modification, and injections.  The patient had   radiographic changes of bone-on-bone arthritis with endplate sclerosis and osteophytes noted.      The patient failed conservative measures including medication, injections, and activity modification, and at this point was ready for more definitive measures.   Based on the radiographic changes and failed conservative measures, the patient   decided to proceed with total knee replacement.  Risks of infection,   DVT,  component failure, need for revision surgery, postop course, and   expectations were all   discussed and reviewed.  Consent was obtained for benefit of pain   relief.      PROCEDURE IN DETAIL:  The patient was brought to the operative theater.   Once adequate anesthesia, preoperative antibiotics, 3 gm of Ancef, 1 gm of Tranexamic Acid, and 10 mg of Decadron administered, the patient was positioned supine with the right thigh tourniquet placed.  The  right lower extremity was prepped and draped in sterile fashion.  A time-   out was performed identifying the patient, planned procedure, and   extremity.      The right lower extremity was placed in the [facility] leg holder.  The leg was   exsanguinated, tourniquet elevated to 250 mmHg.  A midline incision was  made followed by median parapatellar arthrotomy.  Following initial   exposure, attention was first directed to the patella.  Precut   measurement was noted to be 25-26 mm.  I resected down to 14-15 mm and used a   41 patellar button to restore patellar height as well as cover the cut   surface.      The lug holes were drilled and a metal shim was placed to protect the   patella from retractors and saw blades.      At this point, attention was now directed to the femur.  The femoral   canal was opened with a drill, irrigated to try to prevent fat emboli.  An   intramedullary rod was passed at 5 degrees valgus, 9 mm of bone was   resected off the distal femur.  Following this resection, the tibia was   subluxated anteriorly.  Using the extramedullary guide, 2 mm of bone was resected off   the proximal medial tibia.  We confirmed the gap would be   stable medially and laterally with a size 6 mm insert as well as confirmed   the cut was perpendicular in the coronal plane, checking with an alignment rod.      Once this was done, I sized the femur to be a size 7 in the anterior-   posterior dimension, chose a standard component based on  medial and   lateral dimension.  The size 7 rotation block was then pinned in   position anterior referenced using the C-clamp to set rotation.  The   anterior, posterior, and  chamfer cuts were made without difficulty nor   notching making certain that I was along the anterior cortex to help   with flexion gap stability.      The final box cut was made off the lateral aspect of distal femur.      At this point, the tibia was sized to be a size 7, the size 7 tray was   then pinned in position through the medial third of the tubercle,   drilled, and keel punched.  Trial reduction was now carried with a 7 femur,  7 tibia, a size 7 then 8 mm PS insert, and the 41 patella botton.  The knee was brought to   extension, full extension with good flexion stability with the patella   tracking through the trochlea without application of pressure.  Femoral lug holes were drilled.  Given   all these findings, the trial components removed.  Final components were   opened and cement was mixed.  The knee was irrigated with normal saline   solution and pulse lavage.  The synovial lining was   then injected with 30 cc of 0.25% Marcaine with epinephrine and 1 cc of Toradol plus 30 cc of NS for a   total of 61 cc.      The knee was irrigated.  Final implants were then cemented onto clean and   dried cut surfaces of bone with the knee brought to extension with a size 8 mm trial insert.      Once the cement had fully cured, the excess cement was removed   throughout the knee.  I confirmed I was satisfied with the range of   motion and stability, and the final size 8 mm PS AOX insert was chosen.  It was   placed into the knee.      The tourniquet had been let down at 34  minutes.  No significant   hemostasis required.  The   extensor mechanism was then reapproximated using #1 Vicryl and #0 V-lock sutures with the knee   in flexion.  The   remaining wound was closed with 2-0 Vicryl and running 4-0 Monocryl.    The knee was cleaned, dried, dressed sterilely using Dermabond and   Aquacel dressing.  The patient was then   brought to recovery room in stable condition, tolerating the procedure   well.   Please note that Physician Assistant, Danae Orleans, PA-C, was present for the entirety of the case, and was utilized for pre-operative positioning, peri-operative retractor management, general facilitation of the procedure.  He was also utilized for primary wound closure at the end of the case.              Pietro Cassis Alvan Dame, M.D.    06/22/2015 2:12 PM

## 2015-06-22 NOTE — Anesthesia Postprocedure Evaluation (Signed)
  Anesthesia Post-op Note  Patient: Albert Hart.  Procedure(s) Performed: Procedure(s) (LRB): TOTAL RIGHT KNEE ARTHROPLASTY (Right)  Patient Location: PACU  Anesthesia Type: Spinal  Level of Consciousness: awake and alert   Airway and Oxygen Therapy: Patient Spontanous Breathing  Post-op Pain: mild  Post-op Assessment: Post-op Vital signs reviewed, Patient's Cardiovascular Status Stable, Respiratory Function Stable, Patent Airway and No signs of Nausea or vomiting  Last Vitals:  Filed Vitals:   06/22/15 1603  BP: 153/73  Pulse: 78  Temp: 36.8 C  Resp: 15    Post-op Vital Signs: stable   Complications: No apparent anesthesia complications

## 2015-06-22 NOTE — Interval H&P Note (Signed)
History and Physical Interval Note:  06/22/2015 11:22 AM  [name]  has presented today for surgery, with the diagnosis of RIGHT KNEE OA  The various methods of treatment have been discussed with the patient and family. After consideration of risks, benefits and other options for treatment, the patient has consented to  Procedure(s): TOTAL RIGHT KNEE ARTHROPLASTY (Right) as a surgical intervention .  The patient's history has been reviewed, patient examined, no change in status, stable for surgery.  I have reviewed the patient's chart and labs.  Questions were answered to the patient's satisfaction.     Mauri Pole

## 2015-06-22 NOTE — Discharge Instructions (Signed)

## 2015-06-22 NOTE — Progress Notes (Signed)
Utilization review completed.  

## 2015-06-23 DIAGNOSIS — E669 Obesity, unspecified: Secondary | ICD-10-CM | POA: Diagnosis present

## 2015-06-23 LAB — BASIC METABOLIC PANEL
ANION GAP: 5 (ref 5–15)
BUN: 22 mg/dL — AB (ref 6–20)
CALCIUM: 8.4 mg/dL — AB (ref 8.9–10.3)
CO2: 27 mmol/L (ref 22–32)
CREATININE: 1.04 mg/dL (ref 0.61–1.24)
Chloride: 105 mmol/L (ref 101–111)
GFR calc Af Amer: >60 mL/min (ref >60)
GLUCOSE: 161 mg/dL — AB (ref 65–99)
Potassium: 4.1 mmol/L (ref 3.5–5.1)
Sodium: 137 mmol/L (ref 135–145)

## 2015-06-23 LAB — CBC
HCT: 35.8 % — ABNORMAL LOW (ref 39.0–52.0)
Hemoglobin: 12.2 g/dL — ABNORMAL LOW (ref 13.0–17.0)
MCH: 33.1 pg (ref 26.0–34.0)
MCHC: 34.1 g/dL (ref 30.0–36.0)
MCV: 97 fL (ref 78.0–100.0)
PLATELETS: 186 10*3/uL (ref 150–400)
RBC: 3.69 MIL/uL — ABNORMAL LOW (ref 4.22–5.81)
RDW: 12.7 % (ref 11.5–15.5)
WBC: 13.8 10*3/uL — AB (ref 4.0–10.5)

## 2015-06-23 MED ORDER — FERROUS SULFATE 325 (65 FE) MG PO TABS: 325.0000 mg | ORAL_TABLET | Freq: Three times a day (TID) | ORAL | Status: AC

## 2015-06-23 MED ORDER — ASPIRIN 325 MG PO TBEC: 325.0000 mg | DELAYED_RELEASE_TABLET | Freq: Two times a day (BID) | ORAL | Status: AC

## 2015-06-23 MED ORDER — POLYETHYLENE GLYCOL 3350 17 G PO PACK: 17.0000 g | PACK | Freq: Two times a day (BID) | ORAL | Status: AC

## 2015-06-23 MED ORDER — TIZANIDINE HCL 4 MG PO TABS: 4.0000 mg | ORAL_TABLET | Freq: Four times a day (QID) | ORAL | Status: AC | PRN

## 2015-06-23 MED ORDER — HYDROCODONE-ACETAMINOPHEN 7.5-325 MG PO TABS: 1.0000 | ORAL_TABLET | ORAL | Status: AC | PRN

## 2015-06-23 MED ORDER — DOCUSATE SODIUM 100 MG PO CAPS: 100.0000 mg | ORAL_CAPSULE | Freq: Two times a day (BID) | ORAL | Status: AC

## 2015-06-23 NOTE — Progress Notes (Signed)
     Subjective: 1 Day Post-Op Procedure(s) (LRB): TOTAL RIGHT KNEE ARTHROPLASTY (Right)   Patient reports pain as mild, pain controlled. No events throughout the night. Ready to be discharged home, if he does well with PT.  Objective:   VITALS:   Filed Vitals:   06/23/15 0627  BP: 114/68  Pulse: 81  Temp: 98.1 F (36.7 C)  Resp: 16    Dorsiflexion/Plantar flexion intact Incision: dressing C/D/I No cellulitis present Compartment soft  LABS  Recent Labs  06/23/15 0426  HGB 12.2*  HCT 35.8*  WBC 13.8*  PLT 186     Recent Labs  06/23/15 0426  NA 137  K 4.1  BUN 22*  CREATININE 1.04  GLUCOSE 161*     Assessment/Plan: 1 Day Post-Op Procedure(s) (LRB): TOTAL RIGHT KNEE ARTHROPLASTY (Right) Foley cath d/c'ed Advance diet Up with therapy D/C IV fluids Discharge home with home health  Follow up in 2 weeks at [facility]. Follow up with OLIN,Kaj Vasil D in 2 weeks.  Contact information:  [facility] [address] [place] [phone]    Obese (BMI 30-39.9) Estimated body mass index is 35.76 kg/(m^2) as calculated from the following:   Height as of this encounter: 6\' 1"  (1.854 m).   Weight as of this encounter: 122.925 kg (271 lb). Patient also counseled that weight may inhibit the healing process Patient counseled that losing weight will help with future health issues       West Pugh. Albert Hart   PAC  06/23/2015, 9:50 AM

## 2015-06-23 NOTE — Progress Notes (Signed)
Physical Therapy Treatment Patient Details Name: Albert Hart. MRN: [id number] DOB: September 01, 1940 Today's Date: 06/23/2015    History of Present Illness R TKA    PT Comments    Pt is ready to DC home from PT standpoint. He walked 350' with RW independently, stair training and HEP training completed.   Follow Up Recommendations  Home health PT     Equipment Recommendations  None recommended by PT    Recommendations for Other Services       Precautions / Restrictions Precautions Precautions: Fall Precaution Comments: pt reports 1 fall over a year ago Restrictions Weight Bearing Restrictions: No    Mobility  Bed Mobility               General bed mobility comments: NT up in chair  Transfers Overall transfer level: Modified independent Equipment used: Rolling walker (2 wheeled) Transfers: Sit to/from Stand Sit to Stand: Modified independent (Device/Increase time)         General transfer comment: verbal cues hand placement, no physical assist needed  Ambulation/Gait Ambulation/Gait assistance: Modified independent (Device/Increase time) Ambulation Distance (Feet): 350 Feet Assistive device: Rolling walker (2 wheeled) Gait Pattern/deviations: Step-through pattern;Decreased weight shift to left     General Gait Details: steady with RW, good sequencing   Stairs Stairs: Yes Stairs assistance: Supervision Stair Management: One rail Left;Step to pattern;Forwards;With crutches Number of Stairs: 2 General stair comments: verbal cues for sequencing  Wheelchair Mobility    Modified Rankin (Stroke Patients Only)       Balance                                    Cognition Arousal/Alertness: Awake/alert Behavior During Therapy: WFL for tasks assessed/performed Overall Cognitive Status: Within Functional Limits for tasks assessed                      Exercises Total Joint Exercises Quad Sets: AROM;Right;10 reps;Supine Long Arc  Quad: AROM;Right;10 reps;Seated Knee Flexion: AROM;Right;10 reps;Seated Goniometric ROM: 5-95* AAROM R knee    General Comments        Pertinent Vitals/Pain Pain Score: 4  Pain Location: R knee Pain Descriptors / Indicators: Sore Pain Intervention(s): Limited activity within patient's tolerance;Premedicated before session;Monitored during session;Ice applied    Home Living Family/patient expects to be discharged to:: Private residence Living Arrangements: Spouse/significant other Available Help at Discharge: Family;Available 24 hours/day Type of Home: House Home Access: Stairs to enter Entrance Stairs-Rails: Right Home Layout: One level Home Equipment: Walker - 2 wheels;Shower seat;Bedside commode      Prior Function Level of Independence: Independent          PT Goals (current goals can now be found in the care plan section) Acute Rehab PT Goals Patient Stated Goal: to get stronger PT Goal Formulation: With patient/family Time For Goal Achievement: 06/25/15 Potential to Achieve Goals: Good Progress towards PT goals: Progressing toward goals    Frequency  7X/week    PT Plan Current plan remains appropriate    Co-evaluation             End of Session Equipment Utilized During Treatment: Gait belt Activity Tolerance: Patient tolerated treatment well Patient left: in chair;with call bell/phone within reach;with family/visitor present     Time: 0093-8182 PT Time Calculation (min) (ACUTE ONLY): 23 min  Charges:  $Gait Training: 8-22 mins $Therapeutic Exercise: 8-22 mins  G Codes:      Blondell Reveal Kistler 06/23/2015, 3:20 PM [phone]

## 2015-06-23 NOTE — Evaluation (Signed)
Occupational Therapy Evaluation Patient Details Name: Trae Bovenzi. MRN: [id number] DOB: April 02, 1941 Today's Date: 06/23/2015    History of Present Illness R TKA   Clinical Impression   Education complete regarding ADL activity s/p TKA             Precautions / Restrictions Precautions Precautions: Fall Precaution Comments: pt reports 1 fall over a year ago Restrictions Weight Bearing Restrictions: No      Mobility Bed Mobility Overal bed mobility: Modified Independent             General bed mobility comments: pt in chair  Transfers Overall transfer level: Needs assistance Equipment used: Rolling walker (2 wheeled) Transfers: Sit to/from Stand Sit to Stand: Supervision         General transfer comment: verbal cues hand placement, no physical assist needed    Balance Overall balance assessment: Modified Independent                                          ADL Overall ADL's : Needs assistance/impaired     Grooming: Standing;Wash/dry hands               Lower Body Dressing: Min guard;Sit to/from stand;Cueing for safety   Toilet Transfer: Min guard;RW   Toileting- Clothing Manipulation and Hygiene: Minimal assistance;Sit to/from stand     Tub/Shower Transfer Details (indicate cue type and reason): verbalized safety .  reccomended pt have [place] therapist perform this with pt at home Functional mobility during ADLs: Min guard;Cueing for safety;Cueing for sequencing                 Pertinent Vitals/Pain Pain Assessment: 0-10 Pain Score: 3  Pain Location: r knee Pain Descriptors / Indicators: Sore Pain Intervention(s): Monitored during session     Hand Dominance     Extremity/Trunk Assessment Upper Extremity Assessment Upper Extremity Assessment: Overall WFL for tasks assessed     Communication Communication Communication: No difficulties   Cognition Arousal/Alertness: Awake/alert Behavior During Therapy: WFL  for tasks assessed/performed Overall Cognitive Status: Within Functional Limits for tasks assessed                                Home Living Family/patient expects to be discharged to:: Private residence Living Arrangements: Spouse/significant other Available Help at Discharge: Family;Available 24 hours/day Type of Home: House Home Access: Stairs to enter [employer] of Steps: 2 Entrance Stairs-Rails: Right Home Layout: One level               Home Equipment: Walker - 2 wheels;Shower seat;Bedside commode          Prior Functioning/Environment Level of Independence: Independent                      OT Goals(Current goals can be found in the care plan section) Acute Rehab OT Goals Patient Stated Goal: to get stronger  OT Frequency:                End of Session    Activity Tolerance:  good Patient left:  with PT in room    Time: 1287-8676 OT Time Calculation (min): 15 min Charges:  OT General Charges $OT Visit: 1 Procedure OT Evaluation $Initial OT Evaluation Tier I: 1 Procedure G-Codes:    Betsy Pries  06/23/2015, 1:39 PM

## 2015-06-23 NOTE — Evaluation (Signed)
Physical Therapy Evaluation Patient Details Name: Albert Hart. MRN: [id number] DOB: February 02, 1941 Today's Date: 06/23/2015   History of Present Illness  R TKA  Clinical Impression  Pt is s/p TKA resulting in the deficits listed below (see PT Problem List). Pt ambulated 150' with RW and supervision, performed TKA exercises with supervision, able to do SLR independently. Excellent progress expected. Will do stair training this afternoon, then expect pt will be ready to DC home from PT standpoint.  Pt will benefit from skilled PT to increase their independence and safety with mobility to allow discharge to the venue listed below.      Follow Up Recommendations Home health PT    Equipment Recommendations  None recommended by PT    Recommendations for Other Services       Precautions / Restrictions Precautions Precautions: Fall Precaution Comments: pt reports 1 fall over a year ago Restrictions Weight Bearing Restrictions: No      Mobility  Bed Mobility Overal bed mobility: Modified Independent             General bed mobility comments: HOB up 20*  Transfers Overall transfer level: Needs assistance Equipment used: Rolling walker (2 wheeled) Transfers: Sit to/from Stand Sit to Stand: Supervision         General transfer comment: verbal cues hand placement, no physical assist needed  Ambulation/Gait Ambulation/Gait assistance: Supervision Ambulation Distance (Feet): 150 Feet Assistive device: Rolling walker (2 wheeled) Gait Pattern/deviations: Step-through pattern;Decreased weight shift to right   Gait velocity interpretation: at or above normal speed for age/gender General Gait Details: steady with RW, good sequencing  Stairs            Wheelchair Mobility    Modified Rankin (Stroke Patients Only)       Balance Overall balance assessment: Modified Independent                                           Pertinent Vitals/Pain  Pain Assessment: 0-10 Pain Score: 4  Pain Location: R knee Pain Descriptors / Indicators: Sore Pain Intervention(s): Limited activity within patient's tolerance;Monitored during session;Premedicated before session;Ice applied;Repositioned    Home Living Family/patient expects to be discharged to:: Private residence Living Arrangements: Spouse/significant other Available Help at Discharge: Family;Available 24 hours/day Type of Home: House Home Access: Stairs to enter Entrance Stairs-Rails: Right Entrance Stairs-Number of Steps: 2 Home Layout: One level Home Equipment: Walker - 2 wheels;Shower seat;Bedside commode      Prior Function Level of Independence: Independent               Hand Dominance        Extremity/Trunk Assessment   Upper Extremity Assessment: Overall WFL for tasks assessed           Lower Extremity Assessment: RLE deficits/detail RLE Deficits / Details: SLR 3/5, knee extension 3/5, ankle 5/5, knee flexion AAROM 80*, ext -5*    Cervical / Trunk Assessment: Normal  Communication   Communication: No difficulties  Cognition Arousal/Alertness: Awake/alert Behavior During Therapy: WFL for tasks assessed/performed Overall Cognitive Status: Within Functional Limits for tasks assessed                      General Comments      Exercises Total Joint Exercises Ankle Circles/Pumps: AROM;Both;10 reps;Supine Quad Sets: AROM;Both;10 reps;Supine Short Arc Quad: AROM;Right;10 reps;Supine Heel Slides: AAROM;Right;10 reps;Supine  Straight Leg Raises: AROM;Right;10 reps;Supine Goniometric ROM: 5-80* AAROM R knee      Assessment/Plan    PT Assessment Patient needs continued PT services  PT Diagnosis Acute pain   PT Problem List Decreased strength;Decreased activity tolerance;Decreased range of motion;Pain;Decreased mobility  PT Treatment Interventions DME instruction;Gait training;Stair training;Therapeutic activities;Functional mobility  training;Patient/family education;Therapeutic exercise   PT Goals (Current goals can be found in the Care Plan section) Acute Rehab PT Goals Patient Stated Goal: to get stronger PT Goal Formulation: With patient/family Time For Goal Achievement: 06/25/15 Potential to Achieve Goals: Good    Frequency 7X/week   Barriers to discharge        Co-evaluation               End of Session Equipment Utilized During Treatment: Gait belt Activity Tolerance: Patient tolerated treatment well Patient left: in chair;with call bell/phone within reach;with family/visitor present Nurse Communication: Mobility status         Time: 1025-1108 PT Time Calculation (min) (ACUTE ONLY): 43 min   Charges:   PT Evaluation $Initial PT Evaluation Tier I: 1 Procedure PT Treatments $Gait Training: 8-22 mins $Therapeutic Exercise: 8-22 mins   PT G Codes:        Albert Hart 06/23/2015, 11:24 AM [phone]

## 2015-06-24 DIAGNOSIS — K219 Gastro-esophageal reflux disease without esophagitis: Secondary | ICD-10-CM | POA: Diagnosis not present

## 2015-06-24 DIAGNOSIS — Z96651 Presence of right artificial knee joint: Secondary | ICD-10-CM | POA: Diagnosis not present

## 2015-06-24 DIAGNOSIS — G4733 Obstructive sleep apnea (adult) (pediatric): Secondary | ICD-10-CM | POA: Diagnosis not present

## 2015-06-24 DIAGNOSIS — Z471 Aftercare following joint replacement surgery: Secondary | ICD-10-CM | POA: Diagnosis not present

## 2015-06-24 DIAGNOSIS — I10 Essential (primary) hypertension: Secondary | ICD-10-CM | POA: Diagnosis not present

## 2015-06-24 DIAGNOSIS — Z87891 Personal history of nicotine dependence: Secondary | ICD-10-CM | POA: Diagnosis not present

## 2015-06-24 DIAGNOSIS — M1990 Unspecified osteoarthritis, unspecified site: Secondary | ICD-10-CM | POA: Diagnosis not present

## 2015-06-25 DIAGNOSIS — K219 Gastro-esophageal reflux disease without esophagitis: Secondary | ICD-10-CM | POA: Diagnosis not present

## 2015-06-25 DIAGNOSIS — M1990 Unspecified osteoarthritis, unspecified site: Secondary | ICD-10-CM | POA: Diagnosis not present

## 2015-06-25 DIAGNOSIS — Z87891 Personal history of nicotine dependence: Secondary | ICD-10-CM | POA: Diagnosis not present

## 2015-06-25 DIAGNOSIS — I10 Essential (primary) hypertension: Secondary | ICD-10-CM | POA: Diagnosis not present

## 2015-06-25 DIAGNOSIS — Z96651 Presence of right artificial knee joint: Secondary | ICD-10-CM | POA: Diagnosis not present

## 2015-06-25 DIAGNOSIS — Z471 Aftercare following joint replacement surgery: Secondary | ICD-10-CM | POA: Diagnosis not present

## 2015-06-25 DIAGNOSIS — G4733 Obstructive sleep apnea (adult) (pediatric): Secondary | ICD-10-CM | POA: Diagnosis not present

## 2015-06-28 DIAGNOSIS — Z471 Aftercare following joint replacement surgery: Secondary | ICD-10-CM | POA: Diagnosis not present

## 2015-06-28 DIAGNOSIS — M1990 Unspecified osteoarthritis, unspecified site: Secondary | ICD-10-CM | POA: Diagnosis not present

## 2015-06-28 DIAGNOSIS — G4733 Obstructive sleep apnea (adult) (pediatric): Secondary | ICD-10-CM | POA: Diagnosis not present

## 2015-06-28 DIAGNOSIS — I10 Essential (primary) hypertension: Secondary | ICD-10-CM | POA: Diagnosis not present

## 2015-06-28 DIAGNOSIS — Z96651 Presence of right artificial knee joint: Secondary | ICD-10-CM | POA: Diagnosis not present

## 2015-06-28 DIAGNOSIS — K219 Gastro-esophageal reflux disease without esophagitis: Secondary | ICD-10-CM | POA: Diagnosis not present

## 2015-06-28 DIAGNOSIS — Z87891 Personal history of nicotine dependence: Secondary | ICD-10-CM | POA: Diagnosis not present

## 2015-06-28 NOTE — Discharge Summary (Signed)
Physician Discharge Summary  Patient ID: Albert Hart. MRN: [id number] DOB/AGE: 1941-01-17 74 y.o.  Admit date: 06/22/2015 Discharge date: 06/23/2015   Procedures:  Procedure(s) (LRB): TOTAL RIGHT KNEE ARTHROPLASTY (Right)  Attending Physician:  Dr. Paralee Cancel   Admission Diagnoses:   Right knee primary OA / pain  Discharge Diagnoses:  Principal Problem:   S/P right TKA Active Problems:   Obese  Past Medical History  Diagnosis Date  . Hypertension   . GERD (gastroesophageal reflux disease)   . Arthritis   . Sleep apnea     uses CPAP    HPI:    Albert Hart., 74 y.o. male, has a history of pain and functional disability in the right knee due to arthritis and has failed non-surgical conservative treatments for greater than 12 weeks to include NSAID's and/or analgesics, corticosteriod injections, viscosupplementation injections and activity modification. Onset of symptoms was gradual, starting 3+ years ago with gradually worsening course since that time. The patient noted no past surgery on the right knee(s). Patient currently rates pain in the right knee(s) at 6 out of 10 with activity. Patient has worsening of pain with activity and weight bearing, pain that interferes with activities of daily living, pain with passive range of motion, crepitus and joint swelling. Patient has evidence of periarticular osteophytes and joint space narrowing by imaging studies. There is no active infection. Risks, benefits and expectations were discussed with the patient. Risks including but not limited to the risk of anesthesia, blood clots, nerve damage, blood vessel damage, failure of the prosthesis, infection and up to and including death. Patient understand the risks, benefits and expectations and wishes to proceed with surgery.   PCP: Leonides Sake, MD   Discharged Condition: good  Hospital Course:  Patient underwent the above stated procedure on 06/22/2015. Patient tolerated  the procedure well and brought to the recovery room in good condition and subsequently to the floor.  POD #1 BP: 114/68 ; Pulse: 81 ; Temp: 98.1 F (36.7 C) ; Resp: 16 Patient reports pain as mild, pain controlled. No events throughout the night. Ready to be discharged home, if he does well with PT. Dorsiflexion/plantar flexion intact, incision: dressing C/D/I, no cellulitis present and compartment soft.   LABS  Basename    HGB  12.2  HCT  35.8    Discharge Exam: General appearance: alert, cooperative and no distress Extremities: Homans sign is negative, no sign of DVT, no edema, redness or tenderness in the calves or thighs and no ulcers, gangrene or trophic changes  Disposition: Home with follow up in 2 weeks   Follow-up Information    Follow up with Mauri Pole, MD. Schedule an appointment as soon as possible for a visit in 2 weeks.   Specialty:  Orthopedic Surgery   Contact information:   [address] [place] [address] [phone]       Follow up with [facility].   Why:  home health physical therapy   Contact information:   [address] [place]  [address] [phone]       Discharge Instructions    Call MD / Call 911    Complete by:  As directed   If you experience chest pain or shortness of breath, CALL 911 and be transported to the hospital emergency room.  If you develope a fever above 101 F, pus (white drainage) or increased drainage or redness at the wound, or calf pain, call your  surgeon's office.     Change dressing    Complete by:  As directed   Maintain surgical dressing until follow up in the clinic. If the edges start to pull up, may reinforce with tape. If the dressing is no longer working, may remove and cover with gauze and tape, but must keep the area dry and clean.  Call with any questions or concerns.     Constipation Prevention    Complete by:  As directed   Drink plenty of fluids.  Prune  juice may be helpful.  You may use a stool softener, such as Colace (over the counter) 100 mg twice a day.  Use MiraLax (over the counter) for constipation as needed.     Diet - low sodium heart healthy    Complete by:  As directed      Discharge instructions    Complete by:  As directed   Maintain surgical dressing until follow up in the clinic. If the edges start to pull up, may reinforce with tape. If the dressing is no longer working, may remove and cover with gauze and tape, but must keep the area dry and clean.  Follow up in 2 weeks at [facility]. Call with any questions or concerns.     Increase activity slowly as tolerated    Complete by:  As directed   Weight bearing as tolerated with assist device (walker, cane, etc) as directed, use it as long as suggested by your surgeon or therapist, typically at least 4-6 weeks.     TED hose    Complete by:  As directed   Use stockings (TED hose) for 2 weeks on both leg(s).  You may remove them at night for sleeping.             Medication List    TAKE these medications        allopurinol 300 MG tablet  Commonly known as:  ZYLOPRIM  Take 300 mg by mouth at bedtime.     aspirin 325 MG EC tablet  Take 1 tablet (325 mg total) by mouth 2 (two) times daily.     carvedilol 6.25 MG tablet  Commonly known as:  COREG  Take 6.25 mg by mouth 2 (two) times daily with a meal.     docusate sodium 100 MG capsule  Commonly known as:  COLACE  Take 1 capsule (100 mg total) by mouth 2 (two) times daily.     doxazosin 2 MG tablet  Commonly known as:  CARDURA  Take 2 mg by mouth at bedtime.     ferrous sulfate 325 (65 FE) MG tablet  Take 1 tablet (325 mg total) by mouth 3 (three) times daily after meals.     HYDROcodone-acetaminophen 7.5-325 MG tablet  Commonly known as:  NORCO  Take 1-2 tablets by mouth every 4 (four) hours as needed for moderate pain.     losartan-hydrochlorothiazide 100-12.5 MG tablet  Commonly known as:   HYZAAR  Take 1 tablet by mouth at bedtime.     omeprazole 20 MG tablet  Commonly known as:  PRILOSEC OTC  Take 20 mg by mouth at bedtime.     polyethylene glycol packet  Commonly known as:  MIRALAX / GLYCOLAX  Take 17 g by mouth 2 (two) times daily.     simvastatin 40 MG tablet  Commonly known as:  ZOCOR  Take 40 mg by mouth at bedtime.     tiZANidine 4 MG tablet  Commonly  known as:  ZANAFLEX  Take 1 tablet (4 mg total) by mouth every 6 (six) hours as needed for muscle spasms.     vitamin B-12 1000 MCG tablet  Commonly known as:  CYANOCOBALAMIN  Take 1,000 mcg by mouth at bedtime.         Signed: West Pugh. Jemell Town   PA-C  06/28/2015, 9:30 AM

## 2015-06-30 DIAGNOSIS — Z96651 Presence of right artificial knee joint: Secondary | ICD-10-CM | POA: Diagnosis not present

## 2015-06-30 DIAGNOSIS — Z471 Aftercare following joint replacement surgery: Secondary | ICD-10-CM | POA: Diagnosis not present

## 2015-06-30 DIAGNOSIS — Z87891 Personal history of nicotine dependence: Secondary | ICD-10-CM | POA: Diagnosis not present

## 2015-06-30 DIAGNOSIS — M1990 Unspecified osteoarthritis, unspecified site: Secondary | ICD-10-CM | POA: Diagnosis not present

## 2015-06-30 DIAGNOSIS — G4733 Obstructive sleep apnea (adult) (pediatric): Secondary | ICD-10-CM | POA: Diagnosis not present

## 2015-06-30 DIAGNOSIS — I10 Essential (primary) hypertension: Secondary | ICD-10-CM | POA: Diagnosis not present

## 2015-06-30 DIAGNOSIS — K219 Gastro-esophageal reflux disease without esophagitis: Secondary | ICD-10-CM | POA: Diagnosis not present

## 2015-07-02 DIAGNOSIS — I10 Essential (primary) hypertension: Secondary | ICD-10-CM | POA: Diagnosis not present

## 2015-07-02 DIAGNOSIS — G4733 Obstructive sleep apnea (adult) (pediatric): Secondary | ICD-10-CM | POA: Diagnosis not present

## 2015-07-02 DIAGNOSIS — Z96651 Presence of right artificial knee joint: Secondary | ICD-10-CM | POA: Diagnosis not present

## 2015-07-02 DIAGNOSIS — K219 Gastro-esophageal reflux disease without esophagitis: Secondary | ICD-10-CM | POA: Diagnosis not present

## 2015-07-02 DIAGNOSIS — M1990 Unspecified osteoarthritis, unspecified site: Secondary | ICD-10-CM | POA: Diagnosis not present

## 2015-07-02 DIAGNOSIS — Z471 Aftercare following joint replacement surgery: Secondary | ICD-10-CM | POA: Diagnosis not present

## 2015-07-02 DIAGNOSIS — Z87891 Personal history of nicotine dependence: Secondary | ICD-10-CM | POA: Diagnosis not present

## 2015-07-04 DIAGNOSIS — Z96651 Presence of right artificial knee joint: Secondary | ICD-10-CM | POA: Diagnosis not present

## 2015-07-04 DIAGNOSIS — K219 Gastro-esophageal reflux disease without esophagitis: Secondary | ICD-10-CM | POA: Diagnosis not present

## 2015-07-04 DIAGNOSIS — G4733 Obstructive sleep apnea (adult) (pediatric): Secondary | ICD-10-CM | POA: Diagnosis not present

## 2015-07-04 DIAGNOSIS — M1990 Unspecified osteoarthritis, unspecified site: Secondary | ICD-10-CM | POA: Diagnosis not present

## 2015-07-04 DIAGNOSIS — I10 Essential (primary) hypertension: Secondary | ICD-10-CM | POA: Diagnosis not present

## 2015-07-04 DIAGNOSIS — Z87891 Personal history of nicotine dependence: Secondary | ICD-10-CM | POA: Diagnosis not present

## 2015-07-04 DIAGNOSIS — Z471 Aftercare following joint replacement surgery: Secondary | ICD-10-CM | POA: Diagnosis not present

## 2015-07-05 DIAGNOSIS — Z471 Aftercare following joint replacement surgery: Secondary | ICD-10-CM | POA: Diagnosis not present

## 2015-07-05 DIAGNOSIS — G4733 Obstructive sleep apnea (adult) (pediatric): Secondary | ICD-10-CM | POA: Diagnosis not present

## 2015-07-05 DIAGNOSIS — Z96651 Presence of right artificial knee joint: Secondary | ICD-10-CM | POA: Diagnosis not present

## 2015-07-05 DIAGNOSIS — I10 Essential (primary) hypertension: Secondary | ICD-10-CM | POA: Diagnosis not present

## 2015-07-05 DIAGNOSIS — K219 Gastro-esophageal reflux disease without esophagitis: Secondary | ICD-10-CM | POA: Diagnosis not present

## 2015-07-05 DIAGNOSIS — Z87891 Personal history of nicotine dependence: Secondary | ICD-10-CM | POA: Diagnosis not present

## 2015-07-05 DIAGNOSIS — M1990 Unspecified osteoarthritis, unspecified site: Secondary | ICD-10-CM | POA: Diagnosis not present

## 2015-07-06 DIAGNOSIS — Z96651 Presence of right artificial knee joint: Secondary | ICD-10-CM | POA: Diagnosis not present

## 2015-07-06 DIAGNOSIS — I10 Essential (primary) hypertension: Secondary | ICD-10-CM | POA: Diagnosis not present

## 2015-07-06 DIAGNOSIS — G4733 Obstructive sleep apnea (adult) (pediatric): Secondary | ICD-10-CM | POA: Diagnosis not present

## 2015-07-06 DIAGNOSIS — Z87891 Personal history of nicotine dependence: Secondary | ICD-10-CM | POA: Diagnosis not present

## 2015-07-06 DIAGNOSIS — Z471 Aftercare following joint replacement surgery: Secondary | ICD-10-CM | POA: Diagnosis not present

## 2015-07-06 DIAGNOSIS — M1990 Unspecified osteoarthritis, unspecified site: Secondary | ICD-10-CM | POA: Diagnosis not present

## 2015-07-06 DIAGNOSIS — K219 Gastro-esophageal reflux disease without esophagitis: Secondary | ICD-10-CM | POA: Diagnosis not present

## 2015-07-12 DIAGNOSIS — K219 Gastro-esophageal reflux disease without esophagitis: Secondary | ICD-10-CM | POA: Diagnosis not present

## 2015-07-12 DIAGNOSIS — Z87891 Personal history of nicotine dependence: Secondary | ICD-10-CM | POA: Diagnosis not present

## 2015-07-12 DIAGNOSIS — G4733 Obstructive sleep apnea (adult) (pediatric): Secondary | ICD-10-CM | POA: Diagnosis not present

## 2015-07-12 DIAGNOSIS — Z471 Aftercare following joint replacement surgery: Secondary | ICD-10-CM | POA: Diagnosis not present

## 2015-07-12 DIAGNOSIS — I10 Essential (primary) hypertension: Secondary | ICD-10-CM | POA: Diagnosis not present

## 2015-07-12 DIAGNOSIS — M1990 Unspecified osteoarthritis, unspecified site: Secondary | ICD-10-CM | POA: Diagnosis not present

## 2015-07-12 DIAGNOSIS — Z96651 Presence of right artificial knee joint: Secondary | ICD-10-CM | POA: Diagnosis not present

## 2015-07-15 DIAGNOSIS — M1990 Unspecified osteoarthritis, unspecified site: Secondary | ICD-10-CM | POA: Diagnosis not present

## 2015-07-15 DIAGNOSIS — Z96651 Presence of right artificial knee joint: Secondary | ICD-10-CM | POA: Diagnosis not present

## 2015-07-15 DIAGNOSIS — I10 Essential (primary) hypertension: Secondary | ICD-10-CM | POA: Diagnosis not present

## 2015-07-15 DIAGNOSIS — Z87891 Personal history of nicotine dependence: Secondary | ICD-10-CM | POA: Diagnosis not present

## 2015-07-15 DIAGNOSIS — K219 Gastro-esophageal reflux disease without esophagitis: Secondary | ICD-10-CM | POA: Diagnosis not present

## 2015-07-15 DIAGNOSIS — K5900 Constipation, unspecified: Secondary | ICD-10-CM | POA: Diagnosis not present

## 2015-07-15 DIAGNOSIS — G4733 Obstructive sleep apnea (adult) (pediatric): Secondary | ICD-10-CM | POA: Diagnosis not present

## 2015-07-15 DIAGNOSIS — Z471 Aftercare following joint replacement surgery: Secondary | ICD-10-CM | POA: Diagnosis not present

## 2015-07-15 DIAGNOSIS — R140 Abdominal distension (gaseous): Secondary | ICD-10-CM | POA: Diagnosis not present

## 2015-07-16 DIAGNOSIS — K219 Gastro-esophageal reflux disease without esophagitis: Secondary | ICD-10-CM | POA: Diagnosis not present

## 2015-07-16 DIAGNOSIS — M1990 Unspecified osteoarthritis, unspecified site: Secondary | ICD-10-CM | POA: Diagnosis not present

## 2015-07-16 DIAGNOSIS — I10 Essential (primary) hypertension: Secondary | ICD-10-CM | POA: Diagnosis not present

## 2015-07-16 DIAGNOSIS — Z96651 Presence of right artificial knee joint: Secondary | ICD-10-CM | POA: Diagnosis not present

## 2015-07-16 DIAGNOSIS — Z471 Aftercare following joint replacement surgery: Secondary | ICD-10-CM | POA: Diagnosis not present

## 2015-07-16 DIAGNOSIS — Z87891 Personal history of nicotine dependence: Secondary | ICD-10-CM | POA: Diagnosis not present

## 2015-07-16 DIAGNOSIS — G4733 Obstructive sleep apnea (adult) (pediatric): Secondary | ICD-10-CM | POA: Diagnosis not present

## 2015-07-19 DIAGNOSIS — Z471 Aftercare following joint replacement surgery: Secondary | ICD-10-CM | POA: Diagnosis not present

## 2015-07-19 DIAGNOSIS — Z87891 Personal history of nicotine dependence: Secondary | ICD-10-CM | POA: Diagnosis not present

## 2015-07-19 DIAGNOSIS — M1990 Unspecified osteoarthritis, unspecified site: Secondary | ICD-10-CM | POA: Diagnosis not present

## 2015-07-19 DIAGNOSIS — G4733 Obstructive sleep apnea (adult) (pediatric): Secondary | ICD-10-CM | POA: Diagnosis not present

## 2015-07-19 DIAGNOSIS — I10 Essential (primary) hypertension: Secondary | ICD-10-CM | POA: Diagnosis not present

## 2015-07-19 DIAGNOSIS — Z96651 Presence of right artificial knee joint: Secondary | ICD-10-CM | POA: Diagnosis not present

## 2015-07-19 DIAGNOSIS — K219 Gastro-esophageal reflux disease without esophagitis: Secondary | ICD-10-CM | POA: Diagnosis not present

## 2015-07-21 DIAGNOSIS — Z96651 Presence of right artificial knee joint: Secondary | ICD-10-CM | POA: Diagnosis not present

## 2015-07-21 DIAGNOSIS — G4733 Obstructive sleep apnea (adult) (pediatric): Secondary | ICD-10-CM | POA: Diagnosis not present

## 2015-07-21 DIAGNOSIS — Z87891 Personal history of nicotine dependence: Secondary | ICD-10-CM | POA: Diagnosis not present

## 2015-07-21 DIAGNOSIS — I10 Essential (primary) hypertension: Secondary | ICD-10-CM | POA: Diagnosis not present

## 2015-07-21 DIAGNOSIS — M1990 Unspecified osteoarthritis, unspecified site: Secondary | ICD-10-CM | POA: Diagnosis not present

## 2015-07-21 DIAGNOSIS — Z471 Aftercare following joint replacement surgery: Secondary | ICD-10-CM | POA: Diagnosis not present

## 2015-07-21 DIAGNOSIS — K219 Gastro-esophageal reflux disease without esophagitis: Secondary | ICD-10-CM | POA: Diagnosis not present

## 2015-07-23 DIAGNOSIS — I10 Essential (primary) hypertension: Secondary | ICD-10-CM | POA: Diagnosis not present

## 2015-07-23 DIAGNOSIS — Z471 Aftercare following joint replacement surgery: Secondary | ICD-10-CM | POA: Diagnosis not present

## 2015-07-23 DIAGNOSIS — Z96651 Presence of right artificial knee joint: Secondary | ICD-10-CM | POA: Diagnosis not present

## 2015-07-23 DIAGNOSIS — G4733 Obstructive sleep apnea (adult) (pediatric): Secondary | ICD-10-CM | POA: Diagnosis not present

## 2015-07-23 DIAGNOSIS — M1990 Unspecified osteoarthritis, unspecified site: Secondary | ICD-10-CM | POA: Diagnosis not present

## 2015-07-23 DIAGNOSIS — K219 Gastro-esophageal reflux disease without esophagitis: Secondary | ICD-10-CM | POA: Diagnosis not present

## 2015-07-23 DIAGNOSIS — Z87891 Personal history of nicotine dependence: Secondary | ICD-10-CM | POA: Diagnosis not present

## 2015-11-17 DIAGNOSIS — Z23 Encounter for immunization: Secondary | ICD-10-CM | POA: Diagnosis not present

## 2015-11-17 DIAGNOSIS — M109 Gout, unspecified: Secondary | ICD-10-CM | POA: Diagnosis not present

## 2015-11-17 DIAGNOSIS — E782 Mixed hyperlipidemia: Secondary | ICD-10-CM | POA: Diagnosis not present

## 2015-11-17 DIAGNOSIS — I10 Essential (primary) hypertension: Secondary | ICD-10-CM | POA: Diagnosis not present

## 2015-11-17 DIAGNOSIS — Z6834 Body mass index (BMI) 34.0-34.9, adult: Secondary | ICD-10-CM | POA: Diagnosis not present

## 2015-11-17 DIAGNOSIS — M179 Osteoarthritis of knee, unspecified: Secondary | ICD-10-CM | POA: Diagnosis not present

## 2016-03-15 DIAGNOSIS — G4733 Obstructive sleep apnea (adult) (pediatric): Secondary | ICD-10-CM | POA: Diagnosis not present

## 2016-05-19 DIAGNOSIS — N400 Enlarged prostate without lower urinary tract symptoms: Secondary | ICD-10-CM | POA: Diagnosis not present

## 2016-05-19 DIAGNOSIS — Z23 Encounter for immunization: Secondary | ICD-10-CM | POA: Diagnosis not present

## 2016-05-19 DIAGNOSIS — Z79899 Other long term (current) drug therapy: Secondary | ICD-10-CM | POA: Diagnosis not present

## 2016-05-19 DIAGNOSIS — E782 Mixed hyperlipidemia: Secondary | ICD-10-CM | POA: Diagnosis not present

## 2016-05-19 DIAGNOSIS — I10 Essential (primary) hypertension: Secondary | ICD-10-CM | POA: Diagnosis not present

## 2016-05-19 DIAGNOSIS — Z125 Encounter for screening for malignant neoplasm of prostate: Secondary | ICD-10-CM | POA: Diagnosis not present

## 2016-05-19 DIAGNOSIS — Z1389 Encounter for screening for other disorder: Secondary | ICD-10-CM | POA: Diagnosis not present

## 2016-05-19 DIAGNOSIS — M109 Gout, unspecified: Secondary | ICD-10-CM | POA: Diagnosis not present

## 2016-05-19 DIAGNOSIS — Z9181 History of falling: Secondary | ICD-10-CM | POA: Diagnosis not present

## 2016-05-19 DIAGNOSIS — M179 Osteoarthritis of knee, unspecified: Secondary | ICD-10-CM | POA: Diagnosis not present

## 2016-05-19 DIAGNOSIS — Z0000 Encounter for general adult medical examination without abnormal findings: Secondary | ICD-10-CM | POA: Diagnosis not present

## 2016-07-10 DIAGNOSIS — Z96651 Presence of right artificial knee joint: Secondary | ICD-10-CM | POA: Diagnosis not present

## 2016-07-10 DIAGNOSIS — Z471 Aftercare following joint replacement surgery: Secondary | ICD-10-CM | POA: Diagnosis not present

## 2016-07-10 DIAGNOSIS — M1711 Unilateral primary osteoarthritis, right knee: Secondary | ICD-10-CM | POA: Diagnosis not present

## 2016-11-10 DIAGNOSIS — G4733 Obstructive sleep apnea (adult) (pediatric): Secondary | ICD-10-CM | POA: Diagnosis not present

## 2016-11-23 DIAGNOSIS — E782 Mixed hyperlipidemia: Secondary | ICD-10-CM | POA: Diagnosis not present

## 2016-11-23 DIAGNOSIS — R7303 Prediabetes: Secondary | ICD-10-CM | POA: Diagnosis not present

## 2016-11-23 DIAGNOSIS — Z79899 Other long term (current) drug therapy: Secondary | ICD-10-CM | POA: Diagnosis not present

## 2016-11-23 DIAGNOSIS — I10 Essential (primary) hypertension: Secondary | ICD-10-CM | POA: Diagnosis not present

## 2016-11-23 DIAGNOSIS — N400 Enlarged prostate without lower urinary tract symptoms: Secondary | ICD-10-CM | POA: Diagnosis not present

## 2016-11-23 DIAGNOSIS — M179 Osteoarthritis of knee, unspecified: Secondary | ICD-10-CM | POA: Diagnosis not present

## 2016-11-23 DIAGNOSIS — Z139 Encounter for screening, unspecified: Secondary | ICD-10-CM | POA: Diagnosis not present

## 2016-11-23 DIAGNOSIS — M109 Gout, unspecified: Secondary | ICD-10-CM | POA: Diagnosis not present

## 2016-12-05 DIAGNOSIS — E1165 Type 2 diabetes mellitus with hyperglycemia: Secondary | ICD-10-CM | POA: Diagnosis not present

## 2017-06-14 DIAGNOSIS — I10 Essential (primary) hypertension: Secondary | ICD-10-CM | POA: Diagnosis not present

## 2017-06-14 DIAGNOSIS — M179 Osteoarthritis of knee, unspecified: Secondary | ICD-10-CM | POA: Diagnosis not present

## 2017-06-14 DIAGNOSIS — Z0000 Encounter for general adult medical examination without abnormal findings: Secondary | ICD-10-CM | POA: Diagnosis not present

## 2017-06-14 DIAGNOSIS — E782 Mixed hyperlipidemia: Secondary | ICD-10-CM | POA: Diagnosis not present

## 2017-06-14 DIAGNOSIS — Z9181 History of falling: Secondary | ICD-10-CM | POA: Diagnosis not present

## 2017-06-14 DIAGNOSIS — Z1331 Encounter for screening for depression: Secondary | ICD-10-CM | POA: Diagnosis not present

## 2017-06-14 DIAGNOSIS — M109 Gout, unspecified: Secondary | ICD-10-CM | POA: Diagnosis not present

## 2017-06-14 DIAGNOSIS — Z6837 Body mass index (BMI) 37.0-37.9, adult: Secondary | ICD-10-CM | POA: Diagnosis not present

## 2017-06-14 DIAGNOSIS — E119 Type 2 diabetes mellitus without complications: Secondary | ICD-10-CM | POA: Diagnosis not present

## 2017-06-14 DIAGNOSIS — Z79899 Other long term (current) drug therapy: Secondary | ICD-10-CM | POA: Diagnosis not present

## 2017-06-14 DIAGNOSIS — Z125 Encounter for screening for malignant neoplasm of prostate: Secondary | ICD-10-CM | POA: Diagnosis not present

## 2017-06-28 DIAGNOSIS — L989 Disorder of the skin and subcutaneous tissue, unspecified: Secondary | ICD-10-CM | POA: Diagnosis not present

## 2017-06-28 DIAGNOSIS — C44329 Squamous cell carcinoma of skin of other parts of face: Secondary | ICD-10-CM | POA: Diagnosis not present

## 2017-07-11 DIAGNOSIS — C44229 Squamous cell carcinoma of skin of left ear and external auricular canal: Secondary | ICD-10-CM | POA: Diagnosis not present

## 2017-07-11 DIAGNOSIS — Z85828 Personal history of other malignant neoplasm of skin: Secondary | ICD-10-CM | POA: Diagnosis not present

## 2017-08-01 DIAGNOSIS — Z85828 Personal history of other malignant neoplasm of skin: Secondary | ICD-10-CM | POA: Diagnosis not present

## 2017-08-01 DIAGNOSIS — C44329 Squamous cell carcinoma of skin of other parts of face: Secondary | ICD-10-CM | POA: Diagnosis not present

## 2017-09-27 DIAGNOSIS — H40053 Ocular hypertension, bilateral: Secondary | ICD-10-CM | POA: Diagnosis not present

## 2017-10-25 DIAGNOSIS — H40053 Ocular hypertension, bilateral: Secondary | ICD-10-CM | POA: Diagnosis not present

## 2017-10-30 DIAGNOSIS — G4733 Obstructive sleep apnea (adult) (pediatric): Secondary | ICD-10-CM | POA: Diagnosis not present

## 2018-01-09 DIAGNOSIS — Z6837 Body mass index (BMI) 37.0-37.9, adult: Secondary | ICD-10-CM | POA: Diagnosis not present

## 2018-01-09 DIAGNOSIS — Z139 Encounter for screening, unspecified: Secondary | ICD-10-CM | POA: Diagnosis not present

## 2018-01-09 DIAGNOSIS — N400 Enlarged prostate without lower urinary tract symptoms: Secondary | ICD-10-CM | POA: Diagnosis not present

## 2018-01-09 DIAGNOSIS — Z79899 Other long term (current) drug therapy: Secondary | ICD-10-CM | POA: Diagnosis not present

## 2018-01-09 DIAGNOSIS — M179 Osteoarthritis of knee, unspecified: Secondary | ICD-10-CM | POA: Diagnosis not present

## 2018-01-09 DIAGNOSIS — E119 Type 2 diabetes mellitus without complications: Secondary | ICD-10-CM | POA: Diagnosis not present

## 2018-01-09 DIAGNOSIS — E782 Mixed hyperlipidemia: Secondary | ICD-10-CM | POA: Diagnosis not present

## 2018-01-09 DIAGNOSIS — I10 Essential (primary) hypertension: Secondary | ICD-10-CM | POA: Diagnosis not present

## 2018-01-09 DIAGNOSIS — M109 Gout, unspecified: Secondary | ICD-10-CM | POA: Diagnosis not present

## 2018-01-24 DIAGNOSIS — H401131 Primary open-angle glaucoma, bilateral, mild stage: Secondary | ICD-10-CM | POA: Diagnosis not present

## 2018-04-23 DIAGNOSIS — Z6837 Body mass index (BMI) 37.0-37.9, adult: Secondary | ICD-10-CM | POA: Diagnosis not present

## 2018-04-23 DIAGNOSIS — K645 Perianal venous thrombosis: Secondary | ICD-10-CM | POA: Diagnosis not present

## 2018-04-23 DIAGNOSIS — M179 Osteoarthritis of knee, unspecified: Secondary | ICD-10-CM | POA: Diagnosis not present

## 2018-04-24 DIAGNOSIS — K645 Perianal venous thrombosis: Secondary | ICD-10-CM | POA: Diagnosis not present

## 2018-05-01 DIAGNOSIS — K645 Perianal venous thrombosis: Secondary | ICD-10-CM | POA: Diagnosis not present

## 2018-05-01 DIAGNOSIS — Z6836 Body mass index (BMI) 36.0-36.9, adult: Secondary | ICD-10-CM | POA: Diagnosis not present

## 2018-05-02 DIAGNOSIS — Z23 Encounter for immunization: Secondary | ICD-10-CM | POA: Diagnosis not present

## 2018-07-01 DIAGNOSIS — E785 Hyperlipidemia, unspecified: Secondary | ICD-10-CM | POA: Diagnosis not present

## 2018-07-01 DIAGNOSIS — Z9181 History of falling: Secondary | ICD-10-CM | POA: Diagnosis not present

## 2018-07-01 DIAGNOSIS — Z6836 Body mass index (BMI) 36.0-36.9, adult: Secondary | ICD-10-CM | POA: Diagnosis not present

## 2018-07-01 DIAGNOSIS — Z136 Encounter for screening for cardiovascular disorders: Secondary | ICD-10-CM | POA: Diagnosis not present

## 2018-07-01 DIAGNOSIS — Z125 Encounter for screening for malignant neoplasm of prostate: Secondary | ICD-10-CM | POA: Diagnosis not present

## 2018-07-01 DIAGNOSIS — Z1331 Encounter for screening for depression: Secondary | ICD-10-CM | POA: Diagnosis not present

## 2018-07-01 DIAGNOSIS — E669 Obesity, unspecified: Secondary | ICD-10-CM | POA: Diagnosis not present

## 2018-07-01 DIAGNOSIS — Z139 Encounter for screening, unspecified: Secondary | ICD-10-CM | POA: Diagnosis not present

## 2018-07-01 DIAGNOSIS — Z0000 Encounter for general adult medical examination without abnormal findings: Secondary | ICD-10-CM | POA: Diagnosis not present

## 2018-07-18 DIAGNOSIS — N400 Enlarged prostate without lower urinary tract symptoms: Secondary | ICD-10-CM | POA: Diagnosis not present

## 2018-07-18 DIAGNOSIS — M109 Gout, unspecified: Secondary | ICD-10-CM | POA: Diagnosis not present

## 2018-07-18 DIAGNOSIS — I10 Essential (primary) hypertension: Secondary | ICD-10-CM | POA: Diagnosis not present

## 2018-07-18 DIAGNOSIS — E782 Mixed hyperlipidemia: Secondary | ICD-10-CM | POA: Diagnosis not present

## 2018-07-18 DIAGNOSIS — E119 Type 2 diabetes mellitus without complications: Secondary | ICD-10-CM | POA: Diagnosis not present

## 2018-07-18 DIAGNOSIS — Z6835 Body mass index (BMI) 35.0-35.9, adult: Secondary | ICD-10-CM | POA: Diagnosis not present

## 2018-07-18 DIAGNOSIS — M179 Osteoarthritis of knee, unspecified: Secondary | ICD-10-CM | POA: Diagnosis not present

## 2018-07-26 DIAGNOSIS — H401131 Primary open-angle glaucoma, bilateral, mild stage: Secondary | ICD-10-CM | POA: Diagnosis not present

## 2018-08-08 DIAGNOSIS — Z6834 Body mass index (BMI) 34.0-34.9, adult: Secondary | ICD-10-CM | POA: Diagnosis not present

## 2018-08-08 DIAGNOSIS — M109 Gout, unspecified: Secondary | ICD-10-CM | POA: Diagnosis not present

## 2018-08-08 DIAGNOSIS — M79601 Pain in right arm: Secondary | ICD-10-CM | POA: Diagnosis not present

## 2019-01-17 DIAGNOSIS — M179 Osteoarthritis of knee, unspecified: Secondary | ICD-10-CM | POA: Diagnosis not present

## 2019-01-17 DIAGNOSIS — M5416 Radiculopathy, lumbar region: Secondary | ICD-10-CM | POA: Diagnosis not present

## 2019-01-17 DIAGNOSIS — N400 Enlarged prostate without lower urinary tract symptoms: Secondary | ICD-10-CM | POA: Diagnosis not present

## 2019-01-17 DIAGNOSIS — I10 Essential (primary) hypertension: Secondary | ICD-10-CM | POA: Diagnosis not present

## 2019-01-17 DIAGNOSIS — M109 Gout, unspecified: Secondary | ICD-10-CM | POA: Diagnosis not present

## 2019-01-17 DIAGNOSIS — E782 Mixed hyperlipidemia: Secondary | ICD-10-CM | POA: Diagnosis not present

## 2019-01-17 DIAGNOSIS — E119 Type 2 diabetes mellitus without complications: Secondary | ICD-10-CM | POA: Diagnosis not present

## 2019-01-27 DIAGNOSIS — H401131 Primary open-angle glaucoma, bilateral, mild stage: Secondary | ICD-10-CM | POA: Diagnosis not present

## 2019-01-27 DIAGNOSIS — H2513 Age-related nuclear cataract, bilateral: Secondary | ICD-10-CM | POA: Diagnosis not present

## 2019-03-18 DIAGNOSIS — Z125 Encounter for screening for malignant neoplasm of prostate: Secondary | ICD-10-CM | POA: Diagnosis not present

## 2019-03-18 DIAGNOSIS — I10 Essential (primary) hypertension: Secondary | ICD-10-CM | POA: Diagnosis not present

## 2019-03-18 DIAGNOSIS — E782 Mixed hyperlipidemia: Secondary | ICD-10-CM | POA: Diagnosis not present

## 2019-03-18 DIAGNOSIS — E119 Type 2 diabetes mellitus without complications: Secondary | ICD-10-CM | POA: Diagnosis not present

## 2019-03-18 DIAGNOSIS — N451 Epididymitis: Secondary | ICD-10-CM | POA: Diagnosis not present

## 2019-03-18 DIAGNOSIS — Z6831 Body mass index (BMI) 31.0-31.9, adult: Secondary | ICD-10-CM | POA: Diagnosis not present

## 2019-05-01 DIAGNOSIS — R972 Elevated prostate specific antigen [PSA]: Secondary | ICD-10-CM | POA: Diagnosis not present

## 2019-06-17 DIAGNOSIS — N400 Enlarged prostate without lower urinary tract symptoms: Secondary | ICD-10-CM | POA: Diagnosis not present

## 2019-06-17 DIAGNOSIS — Z6832 Body mass index (BMI) 32.0-32.9, adult: Secondary | ICD-10-CM | POA: Diagnosis not present

## 2019-06-17 DIAGNOSIS — N410 Acute prostatitis: Secondary | ICD-10-CM | POA: Diagnosis not present

## 2019-07-14 DIAGNOSIS — Z6832 Body mass index (BMI) 32.0-32.9, adult: Secondary | ICD-10-CM | POA: Diagnosis not present

## 2019-07-14 DIAGNOSIS — E785 Hyperlipidemia, unspecified: Secondary | ICD-10-CM | POA: Diagnosis not present

## 2019-07-14 DIAGNOSIS — Z9181 History of falling: Secondary | ICD-10-CM | POA: Diagnosis not present

## 2019-07-14 DIAGNOSIS — Z125 Encounter for screening for malignant neoplasm of prostate: Secondary | ICD-10-CM | POA: Diagnosis not present

## 2019-07-14 DIAGNOSIS — Z1331 Encounter for screening for depression: Secondary | ICD-10-CM | POA: Diagnosis not present

## 2019-07-14 DIAGNOSIS — Z0000 Encounter for general adult medical examination without abnormal findings: Secondary | ICD-10-CM | POA: Diagnosis not present

## 2019-07-25 DIAGNOSIS — I10 Essential (primary) hypertension: Secondary | ICD-10-CM | POA: Diagnosis not present

## 2019-07-25 DIAGNOSIS — E119 Type 2 diabetes mellitus without complications: Secondary | ICD-10-CM | POA: Diagnosis not present

## 2019-07-25 DIAGNOSIS — M179 Osteoarthritis of knee, unspecified: Secondary | ICD-10-CM | POA: Diagnosis not present

## 2019-07-25 DIAGNOSIS — Z6832 Body mass index (BMI) 32.0-32.9, adult: Secondary | ICD-10-CM | POA: Diagnosis not present

## 2019-07-25 DIAGNOSIS — M109 Gout, unspecified: Secondary | ICD-10-CM | POA: Diagnosis not present

## 2019-07-25 DIAGNOSIS — Z139 Encounter for screening, unspecified: Secondary | ICD-10-CM | POA: Diagnosis not present

## 2019-07-25 DIAGNOSIS — G4733 Obstructive sleep apnea (adult) (pediatric): Secondary | ICD-10-CM | POA: Diagnosis not present

## 2019-07-25 DIAGNOSIS — E782 Mixed hyperlipidemia: Secondary | ICD-10-CM | POA: Diagnosis not present

## 2019-07-25 DIAGNOSIS — N400 Enlarged prostate without lower urinary tract symptoms: Secondary | ICD-10-CM | POA: Diagnosis not present

## 2019-12-05 DIAGNOSIS — G4733 Obstructive sleep apnea (adult) (pediatric): Secondary | ICD-10-CM | POA: Diagnosis not present

## 2020-04-14 DIAGNOSIS — E119 Type 2 diabetes mellitus without complications: Secondary | ICD-10-CM | POA: Diagnosis not present

## 2020-04-14 DIAGNOSIS — Z6833 Body mass index (BMI) 33.0-33.9, adult: Secondary | ICD-10-CM | POA: Diagnosis not present

## 2020-04-14 DIAGNOSIS — I10 Essential (primary) hypertension: Secondary | ICD-10-CM | POA: Diagnosis not present

## 2020-04-14 DIAGNOSIS — M109 Gout, unspecified: Secondary | ICD-10-CM | POA: Diagnosis not present

## 2020-04-14 DIAGNOSIS — M179 Osteoarthritis of knee, unspecified: Secondary | ICD-10-CM | POA: Diagnosis not present

## 2020-04-14 DIAGNOSIS — N400 Enlarged prostate without lower urinary tract symptoms: Secondary | ICD-10-CM | POA: Diagnosis not present

## 2020-04-14 DIAGNOSIS — G4733 Obstructive sleep apnea (adult) (pediatric): Secondary | ICD-10-CM | POA: Diagnosis not present

## 2020-04-14 DIAGNOSIS — E782 Mixed hyperlipidemia: Secondary | ICD-10-CM | POA: Diagnosis not present

## 2020-06-10 DIAGNOSIS — Z23 Encounter for immunization: Secondary | ICD-10-CM | POA: Diagnosis not present

## 2020-07-02 ENCOUNTER — Other Ambulatory Visit: Payer: Self-pay | Admitting: Gastroenterology

## 2020-07-02 DIAGNOSIS — K921 Melena: Secondary | ICD-10-CM

## 2020-07-02 DIAGNOSIS — R229 Localized swelling, mass and lump, unspecified: Secondary | ICD-10-CM | POA: Diagnosis not present

## 2020-07-02 DIAGNOSIS — K649 Unspecified hemorrhoids: Secondary | ICD-10-CM | POA: Diagnosis not present

## 2020-07-22 ENCOUNTER — Ambulatory Visit
Admission: RE | Admit: 2020-07-22 | Discharge: 2020-07-22 | Disposition: A | Discharge status: Home / Self Care | Payer: Commercial Managed Care - HMO | Source: Ambulatory Visit | Attending: Gastroenterology | Admitting: Gastroenterology

## 2020-07-22 DIAGNOSIS — K635 Polyp of colon: Secondary | ICD-10-CM | POA: Diagnosis not present

## 2020-07-22 DIAGNOSIS — K648 Other hemorrhoids: Secondary | ICD-10-CM | POA: Diagnosis not present

## 2020-07-22 DIAGNOSIS — K5900 Constipation, unspecified: Secondary | ICD-10-CM | POA: Diagnosis not present

## 2020-07-22 DIAGNOSIS — K921 Melena: Secondary | ICD-10-CM

## 2020-09-01 DIAGNOSIS — N400 Enlarged prostate without lower urinary tract symptoms: Secondary | ICD-10-CM | POA: Diagnosis not present

## 2020-09-01 DIAGNOSIS — Z6833 Body mass index (BMI) 33.0-33.9, adult: Secondary | ICD-10-CM | POA: Diagnosis not present

## 2020-09-01 DIAGNOSIS — Z125 Encounter for screening for malignant neoplasm of prostate: Secondary | ICD-10-CM | POA: Diagnosis not present

## 2020-09-01 DIAGNOSIS — K8020 Calculus of gallbladder without cholecystitis without obstruction: Secondary | ICD-10-CM | POA: Diagnosis not present

## 2020-09-01 DIAGNOSIS — I700 Atherosclerosis of aorta: Secondary | ICD-10-CM | POA: Diagnosis not present

## 2020-09-01 DIAGNOSIS — E119 Type 2 diabetes mellitus without complications: Secondary | ICD-10-CM | POA: Diagnosis not present

## 2020-09-01 DIAGNOSIS — K649 Unspecified hemorrhoids: Secondary | ICD-10-CM | POA: Diagnosis not present

## 2020-10-18 DIAGNOSIS — Z9181 History of falling: Secondary | ICD-10-CM | POA: Diagnosis not present

## 2020-10-18 DIAGNOSIS — M109 Gout, unspecified: Secondary | ICD-10-CM | POA: Diagnosis not present

## 2020-10-18 DIAGNOSIS — G4733 Obstructive sleep apnea (adult) (pediatric): Secondary | ICD-10-CM | POA: Diagnosis not present

## 2020-10-18 DIAGNOSIS — I10 Essential (primary) hypertension: Secondary | ICD-10-CM | POA: Diagnosis not present

## 2020-10-18 DIAGNOSIS — E782 Mixed hyperlipidemia: Secondary | ICD-10-CM | POA: Diagnosis not present

## 2020-10-18 DIAGNOSIS — Z139 Encounter for screening, unspecified: Secondary | ICD-10-CM | POA: Diagnosis not present

## 2020-10-18 DIAGNOSIS — I700 Atherosclerosis of aorta: Secondary | ICD-10-CM | POA: Diagnosis not present

## 2020-10-18 DIAGNOSIS — Z1331 Encounter for screening for depression: Secondary | ICD-10-CM | POA: Diagnosis not present

## 2020-10-18 DIAGNOSIS — E119 Type 2 diabetes mellitus without complications: Secondary | ICD-10-CM | POA: Diagnosis not present

## 2020-10-18 DIAGNOSIS — N400 Enlarged prostate without lower urinary tract symptoms: Secondary | ICD-10-CM | POA: Diagnosis not present

## 2020-10-20 DIAGNOSIS — G4733 Obstructive sleep apnea (adult) (pediatric): Secondary | ICD-10-CM | POA: Diagnosis not present

## 2021-04-21 DIAGNOSIS — E782 Mixed hyperlipidemia: Secondary | ICD-10-CM | POA: Diagnosis not present

## 2021-04-21 DIAGNOSIS — M109 Gout, unspecified: Secondary | ICD-10-CM | POA: Diagnosis not present

## 2021-04-21 DIAGNOSIS — I700 Atherosclerosis of aorta: Secondary | ICD-10-CM | POA: Diagnosis not present

## 2021-04-21 DIAGNOSIS — E118 Type 2 diabetes mellitus with unspecified complications: Secondary | ICD-10-CM | POA: Diagnosis not present

## 2021-04-21 DIAGNOSIS — Z1331 Encounter for screening for depression: Secondary | ICD-10-CM | POA: Diagnosis not present

## 2021-04-21 DIAGNOSIS — Z23 Encounter for immunization: Secondary | ICD-10-CM | POA: Diagnosis not present

## 2021-04-21 DIAGNOSIS — I10 Essential (primary) hypertension: Secondary | ICD-10-CM | POA: Diagnosis not present

## 2021-04-21 DIAGNOSIS — Z9181 History of falling: Secondary | ICD-10-CM | POA: Diagnosis not present

## 2021-04-21 DIAGNOSIS — N400 Enlarged prostate without lower urinary tract symptoms: Secondary | ICD-10-CM | POA: Diagnosis not present

## 2021-04-29 DIAGNOSIS — G4733 Obstructive sleep apnea (adult) (pediatric): Secondary | ICD-10-CM | POA: Diagnosis not present

## 2021-06-07 DIAGNOSIS — H40013 Open angle with borderline findings, low risk, bilateral: Secondary | ICD-10-CM | POA: Diagnosis not present

## 2021-08-26 DIAGNOSIS — K642 Third degree hemorrhoids: Secondary | ICD-10-CM | POA: Diagnosis not present

## 2021-09-07 DIAGNOSIS — Z9181 History of falling: Secondary | ICD-10-CM | POA: Diagnosis not present

## 2021-09-07 DIAGNOSIS — Z6834 Body mass index (BMI) 34.0-34.9, adult: Secondary | ICD-10-CM | POA: Diagnosis not present

## 2021-09-07 DIAGNOSIS — Z0000 Encounter for general adult medical examination without abnormal findings: Secondary | ICD-10-CM | POA: Diagnosis not present

## 2021-09-07 DIAGNOSIS — E669 Obesity, unspecified: Secondary | ICD-10-CM | POA: Diagnosis not present

## 2021-09-07 DIAGNOSIS — E785 Hyperlipidemia, unspecified: Secondary | ICD-10-CM | POA: Diagnosis not present

## 2021-09-07 DIAGNOSIS — Z1331 Encounter for screening for depression: Secondary | ICD-10-CM | POA: Diagnosis not present

## 2021-09-12 DIAGNOSIS — Z79899 Other long term (current) drug therapy: Secondary | ICD-10-CM | POA: Diagnosis not present

## 2021-09-12 DIAGNOSIS — G4733 Obstructive sleep apnea (adult) (pediatric): Secondary | ICD-10-CM | POA: Diagnosis not present

## 2021-09-12 DIAGNOSIS — K219 Gastro-esophageal reflux disease without esophagitis: Secondary | ICD-10-CM | POA: Diagnosis not present

## 2021-09-12 DIAGNOSIS — I10 Essential (primary) hypertension: Secondary | ICD-10-CM | POA: Diagnosis not present

## 2021-09-12 DIAGNOSIS — K642 Third degree hemorrhoids: Secondary | ICD-10-CM | POA: Diagnosis not present

## 2021-09-12 DIAGNOSIS — E785 Hyperlipidemia, unspecified: Secondary | ICD-10-CM | POA: Diagnosis not present

## 2021-09-12 DIAGNOSIS — K645 Perianal venous thrombosis: Secondary | ICD-10-CM | POA: Diagnosis not present

## 2021-09-12 DIAGNOSIS — E669 Obesity, unspecified: Secondary | ICD-10-CM | POA: Diagnosis not present

## 2022-02-18 ENCOUNTER — Encounter (HOSPITAL_COMMUNITY): Payer: Self-pay

## 2022-02-18 ENCOUNTER — Other Ambulatory Visit: Payer: Self-pay

## 2022-02-18 ENCOUNTER — Emergency Department (HOSPITAL_COMMUNITY)
Admission: EM | Admit: 2022-02-18 | Discharge: 2022-02-18 | Disposition: A | Discharge status: Home / Self Care | Payer: Medicare HMO | Attending: Emergency Medicine | Admitting: Emergency Medicine

## 2022-02-18 ENCOUNTER — Emergency Department (HOSPITAL_COMMUNITY): Payer: Medicare HMO

## 2022-02-18 DIAGNOSIS — M25562 Pain in left knee: Secondary | ICD-10-CM | POA: Insufficient documentation

## 2022-02-18 DIAGNOSIS — S8992XA Unspecified injury of left lower leg, initial encounter: Secondary | ICD-10-CM | POA: Diagnosis not present

## 2022-02-18 DIAGNOSIS — M2342 Loose body in knee, left knee: Secondary | ICD-10-CM | POA: Diagnosis not present

## 2022-02-18 DIAGNOSIS — M1712 Unilateral primary osteoarthritis, left knee: Secondary | ICD-10-CM | POA: Diagnosis not present

## 2022-02-18 MED ORDER — OXYCODONE-ACETAMINOPHEN 5-325 MG PO TABS
1.0000 | ORAL_TABLET | Freq: Once | ORAL | Status: AC
  Administered 2022-02-18: 1 via ORAL
  Filled 2022-02-18: qty 1

## 2022-02-18 NOTE — ED Triage Notes (Signed)
Pt states for the last few days his left kneecap keeps popping out. Pt states its been popping back in pretty easily but today it popped out and will not go back in. Pt has a brace on left knee at this time.

## 2022-02-18 NOTE — Discharge Instructions (Signed)
You were seen in the emergency department for left knee pain.  Your x-ray today showed significant arthritis in your knee but your knee bones look like they're in the right places.  We have placed you in a brace and given you a dose of pain medication. You can take your prescribed celebrex for pain and ibuprofen as needed.  It's incredibly important you follow up with your orthopedic doctor.

## 2022-02-18 NOTE — ED Provider Notes (Signed)
[facility] DEPT Provider Note   CSN: [id number] Arrival date & time: 02/18/22  0950     History  Chief Complaint  Patient presents with   Left Knee Pain    Albert Hart. is a 81 y.o. male who presents the emergency department complaining of left knee pain.  Patient states that he intermittently has been feeling his left knee "pop out of place".  He states that it usually "pops back in" pretty easily, but today he felt it go out of place and not go back in.  He has been wearing a fabric knee brace that he got from the drugstore intermittently.  Has seen orthopedics for right-sided knee pain in the past and has had knee replacement.  No direct injury to the knee noted.  He has been taking Celebrex as prescribed, but did not take it today.  HPI     Home Medications Prior to Admission medications   Medication Sig Start Date End Date Taking? Authorizing Provider  allopurinol (ZYLOPRIM) 300 MG tablet Take 300 mg by mouth at bedtime.    [provider]  carvedilol (COREG) 6.25 MG tablet Take 6.25 mg by mouth 2 (two) times daily with a meal.    [provider]  docusate sodium (COLACE) 100 MG capsule Take 1 capsule (100 mg total) by mouth 2 (two) times daily. 06/23/15   Danae Orleans, PA-C  doxazosin (CARDURA) 2 MG tablet Take 2 mg by mouth at bedtime.    [provider]  ferrous sulfate 325 (65 FE) MG tablet Take 1 tablet (325 mg total) by mouth 3 (three) times daily after meals. 06/23/15   Danae Orleans, PA-C  HYDROcodone-acetaminophen (NORCO) 7.5-325 MG tablet Take 1-2 tablets by mouth every 4 (four) hours as needed for moderate pain. 06/23/15   Danae Orleans, PA-C  losartan-hydrochlorothiazide (HYZAAR) 100-12.5 MG tablet Take 1 tablet by mouth at bedtime.    [provider]  omeprazole (PRILOSEC OTC) 20 MG tablet Take 20 mg by mouth at bedtime.    [provider]  polyethylene glycol (MIRALAX / GLYCOLAX)  packet Take 17 g by mouth 2 (two) times daily. 06/23/15   Danae Orleans, PA-C  simvastatin (ZOCOR) 40 MG tablet Take 40 mg by mouth at bedtime.    [provider]  tiZANidine (ZANAFLEX) 4 MG tablet Take 1 tablet (4 mg total) by mouth every 6 (six) hours as needed for muscle spasms. 06/23/15   Danae Orleans, PA-C  vitamin B-12 (CYANOCOBALAMIN) 1000 MCG tablet Take 1,000 mcg by mouth at bedtime.    [provider]      Allergies    Patient has no known allergies.    Review of Systems   Review of Systems  Musculoskeletal:  Positive for arthralgias.  Neurological:  Negative for weakness and numbness.  All other systems reviewed and are negative.   Physical Exam Updated Vital Signs BP (!) 179/98   Pulse 74   Temp 98.9 F (37.2 C) (Oral)   Resp 18   SpO2 95%  Physical Exam Vitals and nursing note reviewed.  Constitutional:      Appearance: Normal appearance.  HENT:     Head: Normocephalic and atraumatic.  Eyes:     Conjunctiva/sclera: Conjunctivae normal.  Cardiovascular:     Pulses:          Dorsalis pedis pulses are 2+ on the right side and 2+ on the left side.  Pulmonary:     Effort: Pulmonary effort  is normal. No respiratory distress.  Musculoskeletal:     Comments: No deformities noted to the left knee.  No effusion.  No overlying skin changes.  Normal range of motion.  Neurovascularly intact in bilateral lower extremities.  Skin:    General: Skin is warm and dry.  Neurological:     Mental Status: He is alert.  Psychiatric:        Mood and Affect: Mood normal.        Behavior: Behavior normal.     ED Results / Procedures / Treatments   Labs (all labs ordered are listed, but only abnormal results are displayed) Labs Reviewed - No data to display  EKG None  Radiology DG Knee Complete 4 Views Left  Result Date: 02/18/2022 CLINICAL DATA:  Patient states "knee cap popped out" EXAM: LEFT KNEE - COMPLETE 4+ VIEW COMPARISON:  None Available.  FINDINGS: No evidence for an acute fracture. No subluxation or dislocation. Advanced degenerative spurring is noted in all 3 compartments with evidence of meniscal calcification. Intra-articular loose bodies are seen in the suprapatellar joint space and posterior joint. IMPRESSION: Advanced tricompartmental degenerative changes with intra-articular loose bodies. No acute bony abnormality. No substantial joint effusion. Electronically Signed   By: Misty Stanley M.D.   On: 02/18/2022 11:08    Procedures Procedures    Medications Ordered in ED Medications  oxyCODONE-acetaminophen (PERCOCET/ROXICET) 5-325 MG per tablet 1 tablet (1 tablet Oral Given 02/18/22 1623)    ED Course/ Medical Decision Making/ A&P                           Medical Decision Making Amount and/or Complexity of Data Reviewed Radiology: ordered.  Risk Prescription drug management.   Patient is an 81 year old male who presents emergency department complaining of the left knee pain.  No direct injury but felt his knee "pop out of place".  On exam patient has no palpable deformities to the knee.  He has decent range of motion, and is neurovascularly intact in bilateral lower extremities.  No overlying skin changes or cellulitic findings.  X-ray ordered from triage of the left knee showed advanced tricompartmental degenerative changes with intra-articular loose bodies, but no acute fractures or dislocations.   Discussed with the patient that my exam and imaging does not demonstrate any emergent pathology for his symptoms today. Will give dose of pain medication and immobilize the knee for comfort. Recommended following up with his orthopedist. Will discharge to home and recommend symptomatic management with over the counter medications and his prescribed celebrex. Patient is agreeable to the plan and all questions answered.   Final Clinical Impression(s) / ED Diagnoses Final diagnoses:  Acute pain of left knee    Rx / DC  Orders ED Discharge Orders     None      Portions of this report may have been transcribed using voice recognition software. Every effort was made to ensure accuracy; however, inadvertent computerized transcription errors may be present.    Estill Cotta 02/18/22 1639    Blanchie Dessert, MD 02/19/22 1321

## 2022-06-02 DIAGNOSIS — E782 Mixed hyperlipidemia: Secondary | ICD-10-CM | POA: Diagnosis not present

## 2022-06-02 DIAGNOSIS — Z139 Encounter for screening, unspecified: Secondary | ICD-10-CM | POA: Diagnosis not present

## 2022-06-02 DIAGNOSIS — N400 Enlarged prostate without lower urinary tract symptoms: Secondary | ICD-10-CM | POA: Diagnosis not present

## 2022-06-02 DIAGNOSIS — Z23 Encounter for immunization: Secondary | ICD-10-CM | POA: Diagnosis not present

## 2022-06-02 DIAGNOSIS — I10 Essential (primary) hypertension: Secondary | ICD-10-CM | POA: Diagnosis not present

## 2022-06-02 DIAGNOSIS — G4733 Obstructive sleep apnea (adult) (pediatric): Secondary | ICD-10-CM | POA: Diagnosis not present

## 2022-06-02 DIAGNOSIS — I700 Atherosclerosis of aorta: Secondary | ICD-10-CM | POA: Diagnosis not present

## 2022-06-02 DIAGNOSIS — M109 Gout, unspecified: Secondary | ICD-10-CM | POA: Diagnosis not present

## 2022-06-02 DIAGNOSIS — E118 Type 2 diabetes mellitus with unspecified complications: Secondary | ICD-10-CM | POA: Diagnosis not present

## 2022-09-26 IMAGING — CT CT VIRTUAL COLONOSCOPY DIAGNOSTIC
2 of 9 series · 11 of 46 positions shown, 17 images · non-contrast
Comparison: None.

CLINICAL DATA: Polyps, hemorrhoids, constipation

EXAM:
CT VIRTUAL COLONOSCOPY DIAGNOSTIC
TECHNIQUE: The patient was given a standard bowel preparation with Gastrografin
and barium for fluid and stool tagging respectively. The quality of
the bowel preparation is good. Automated CO2 insufflation of the
colon was performed prior to image acquisition and colonic
distention is good. Image post processing was used to generate a 3D
endoluminal fly-through projection of the colon and to
electronically subtract stool/fluid as appropriate.

[Series 6: supine colon 3.00 br40 s3 cor supine · coronal · 0.96mm/px · 3 of 152 slices shown, 4 images]
[im 38/152  soft-tissue]
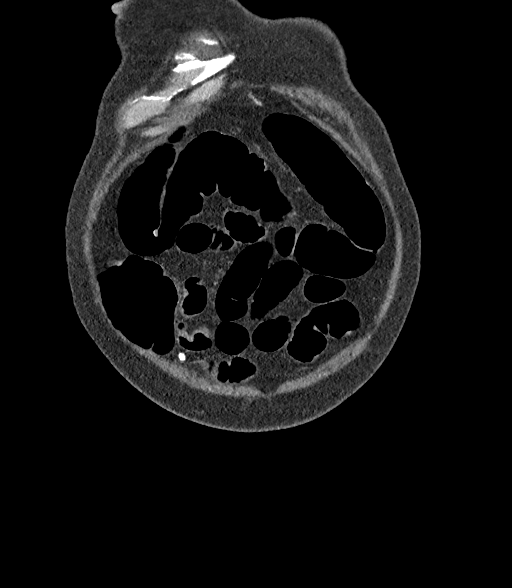
[im 76/152  soft-tissue]
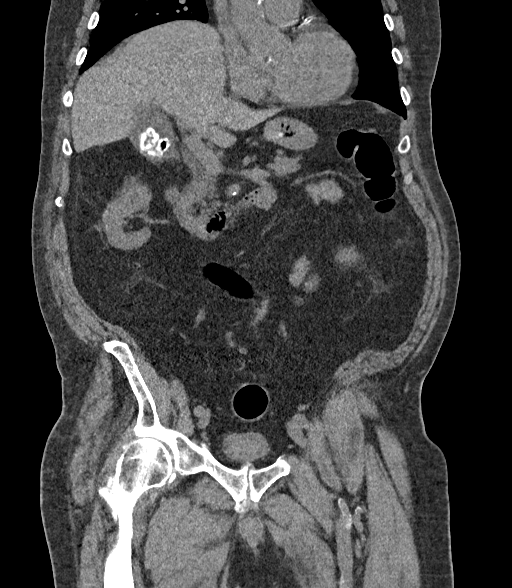
[im 76/152  bone]
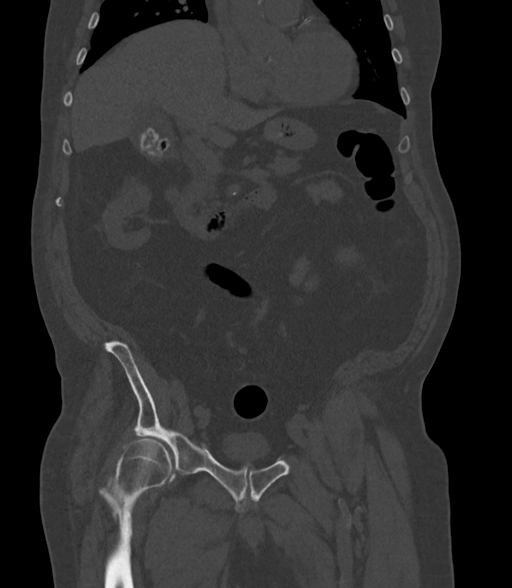
[im 114/152  soft-tissue]
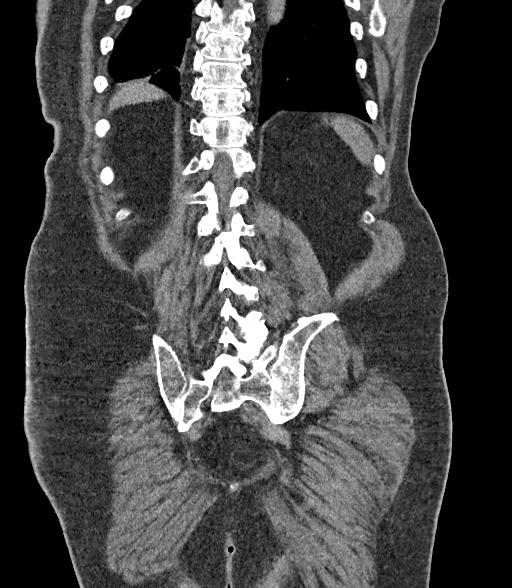

[Series 11: prone colon 1.50 br40 s3 prone thin · axial · 0.92mm/px · z∈[+1233,+1689]mm · 8 of 392 slices shown, 13 images]
[im 44/392  soft-tissue]
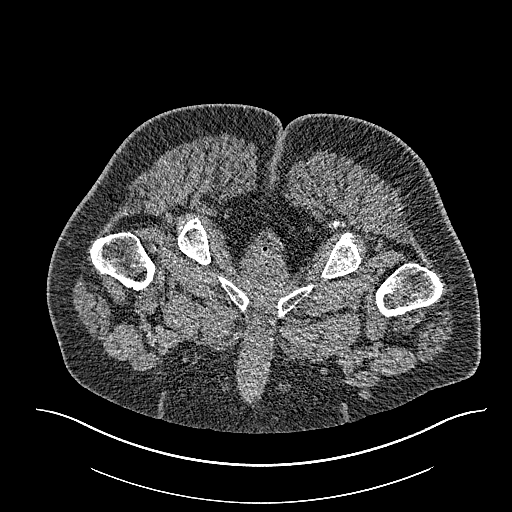
[im 44/392  bone]
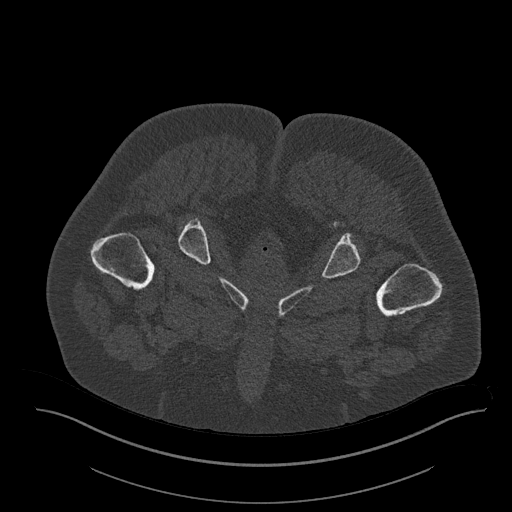
[im 87/392  soft-tissue]
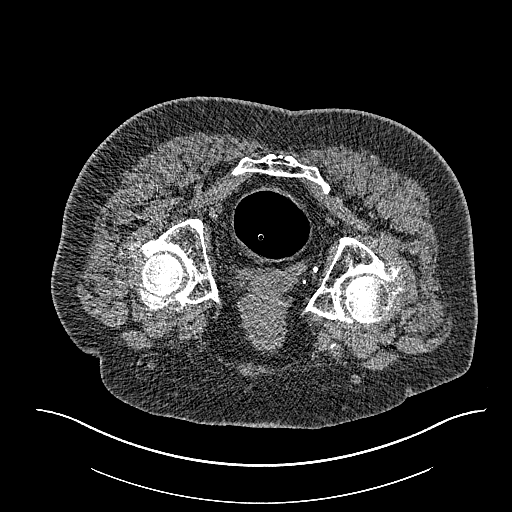
[im 131/392  soft-tissue]
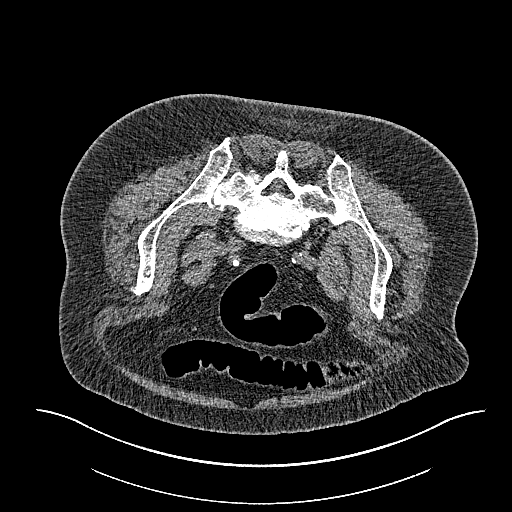
[im 174/392  soft-tissue]
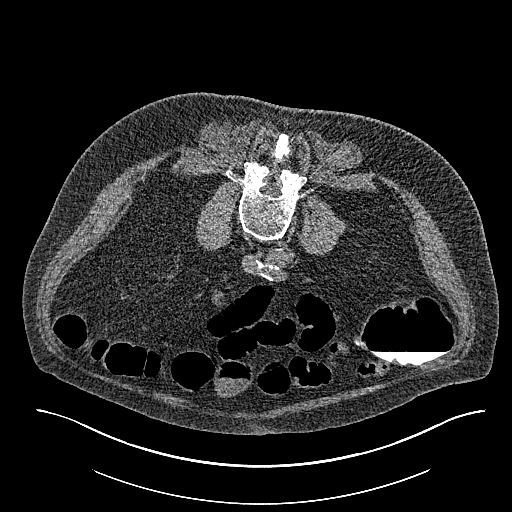
[im 218/392  soft-tissue]
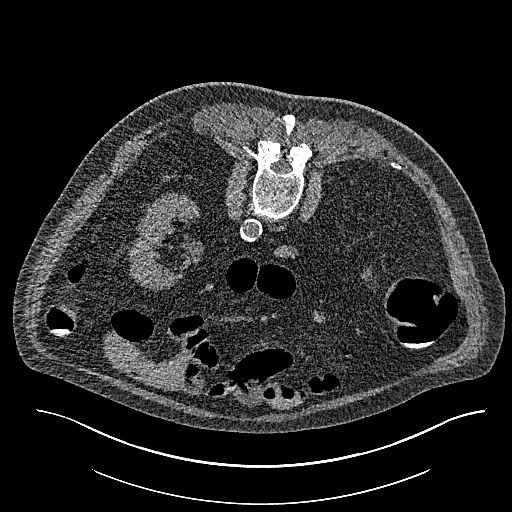
[im 218/392  lung]
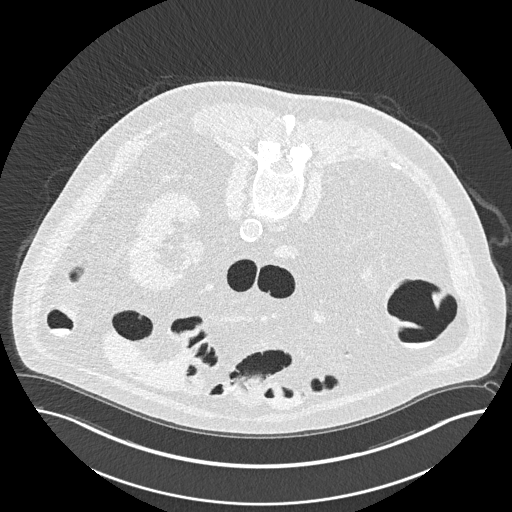
[im 261/392  soft-tissue]
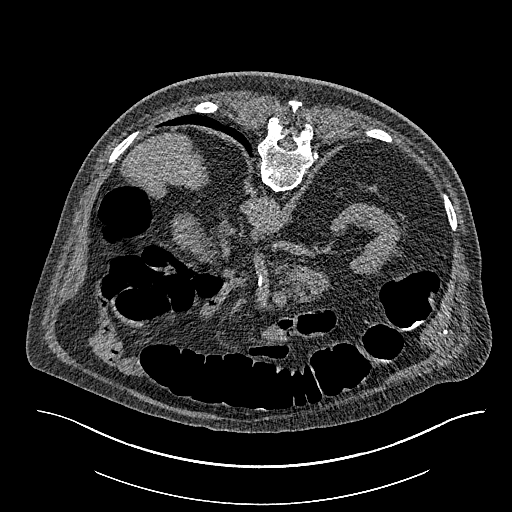
[im 261/392  lung]
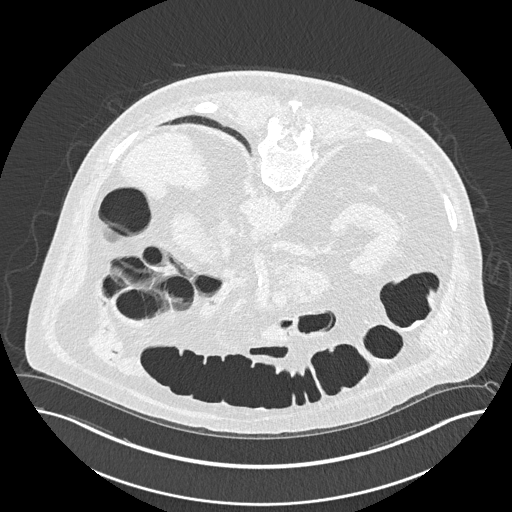
[im 305/392  soft-tissue]
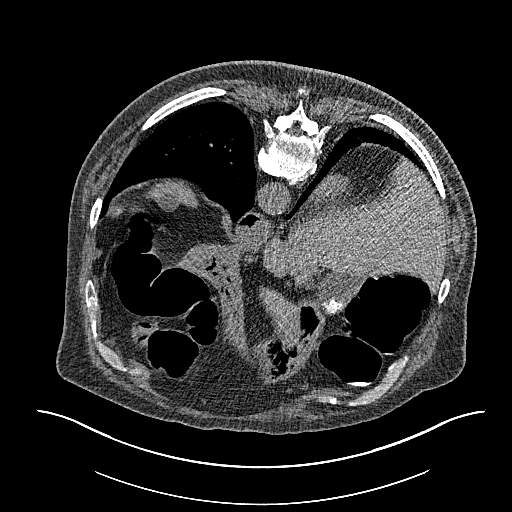
[im 305/392  lung]
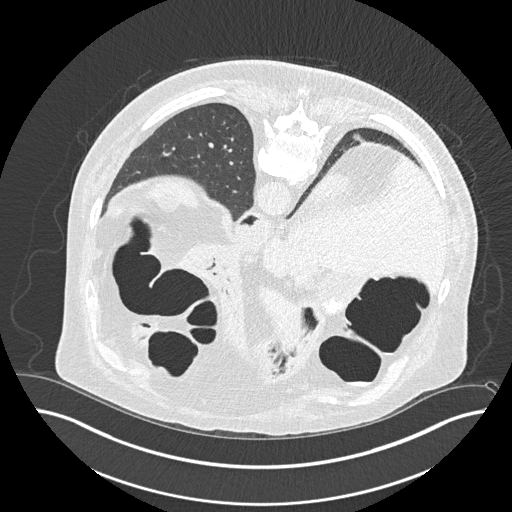
[im 348/392  soft-tissue]
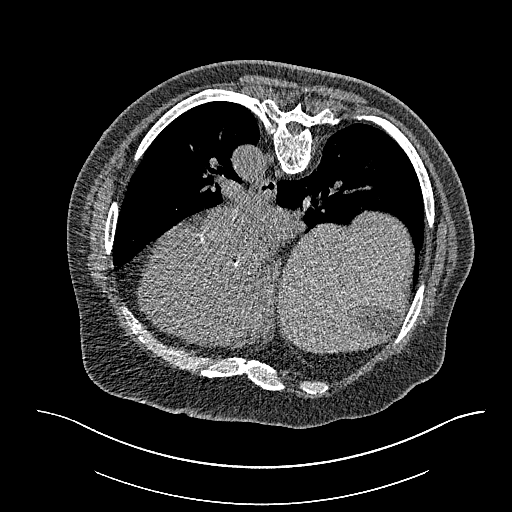
[im 348/392  lung]
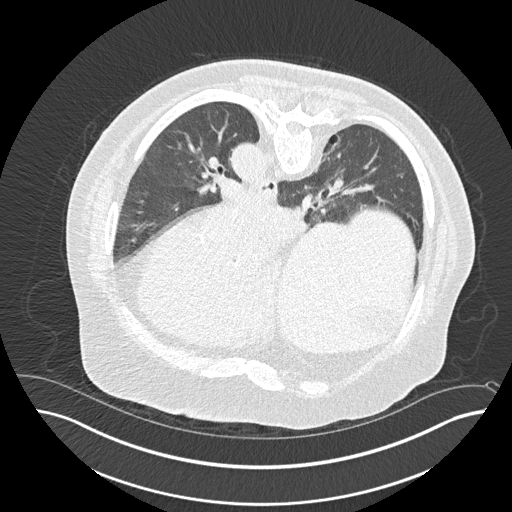

[11 of 46 positions shown; findings below may reference images not displayed]

FINDINGS: VIRTUAL COLONOSCOPY

No fixed non barium tagged polypoid filling defects or annular
constricting lesions. Mild tortuosity of the colon. Appendix is
normal, filled with contrast.

Virtual colonoscopy is not designed to detect diminutive polyps
(i.e., less than or equal to 5 mm), the presence or absence of which
may not affect clinical management.

CT ABDOMEN AND PELVIS WITHOUT CONTRAST

Lower chest: No acute abnormality. Calcifications in the visualized
coronary arteries and aorta.

Hepatobiliary: No focal hepatic abnormality. Multiple gallstones
within the gallbladder.

Pancreas: No focal hepatic abnormality.  Gallbladder unremarkable.

Spleen: No focal abnormality or ductal dilatation.

Adrenals/Urinary Tract: Small low-density lesions within the
kidneys, likely cysts. No hydronephrosis. Adrenal glands and urinary
bladder unremarkable.

Stomach/Bowel: Stomach and small bowel decompressed, unremarkable.

Vascular/Lymphatic: Heavily calcified aorta. No evidence of aneurysm
or adenopathy.

Reproductive: Mildly prominent prostate.

Other: No free fluid or free air.

Musculoskeletal: No acute bony abnormality. Degenerative disc and
facet disease in the lumbar spine.
IMPRESSION: No fixed polypoid filling defects or annular constricting lesions
within the colon.

Cholelithiasis.

Aortic atherosclerosis.

Prostate enlargement.

## 2022-10-10 DIAGNOSIS — H903 Sensorineural hearing loss, bilateral: Secondary | ICD-10-CM | POA: Diagnosis not present

## 2022-12-04 DIAGNOSIS — I700 Atherosclerosis of aorta: Secondary | ICD-10-CM | POA: Diagnosis not present

## 2022-12-04 DIAGNOSIS — M109 Gout, unspecified: Secondary | ICD-10-CM | POA: Diagnosis not present

## 2022-12-04 DIAGNOSIS — E782 Mixed hyperlipidemia: Secondary | ICD-10-CM | POA: Diagnosis not present

## 2022-12-04 DIAGNOSIS — Z1331 Encounter for screening for depression: Secondary | ICD-10-CM | POA: Diagnosis not present

## 2022-12-04 DIAGNOSIS — I10 Essential (primary) hypertension: Secondary | ICD-10-CM | POA: Diagnosis not present

## 2022-12-04 DIAGNOSIS — E118 Type 2 diabetes mellitus with unspecified complications: Secondary | ICD-10-CM | POA: Diagnosis not present

## 2022-12-04 DIAGNOSIS — N400 Enlarged prostate without lower urinary tract symptoms: Secondary | ICD-10-CM | POA: Diagnosis not present

## 2022-12-04 DIAGNOSIS — Z9181 History of falling: Secondary | ICD-10-CM | POA: Diagnosis not present

## 2022-12-04 DIAGNOSIS — Z139 Encounter for screening, unspecified: Secondary | ICD-10-CM | POA: Diagnosis not present

## 2023-03-21 DIAGNOSIS — G4730 Sleep apnea, unspecified: Secondary | ICD-10-CM | POA: Diagnosis not present

## 2023-07-25 DIAGNOSIS — M109 Gout, unspecified: Secondary | ICD-10-CM | POA: Diagnosis not present

## 2023-07-25 DIAGNOSIS — M179 Osteoarthritis of knee, unspecified: Secondary | ICD-10-CM | POA: Diagnosis not present

## 2023-07-25 DIAGNOSIS — E118 Type 2 diabetes mellitus with unspecified complications: Secondary | ICD-10-CM | POA: Diagnosis not present

## 2023-07-25 DIAGNOSIS — I10 Essential (primary) hypertension: Secondary | ICD-10-CM | POA: Diagnosis not present

## 2023-07-25 DIAGNOSIS — I700 Atherosclerosis of aorta: Secondary | ICD-10-CM | POA: Diagnosis not present

## 2023-07-25 DIAGNOSIS — Z23 Encounter for immunization: Secondary | ICD-10-CM | POA: Diagnosis not present

## 2023-07-25 DIAGNOSIS — E782 Mixed hyperlipidemia: Secondary | ICD-10-CM | POA: Diagnosis not present

## 2023-07-25 DIAGNOSIS — N400 Enlarged prostate without lower urinary tract symptoms: Secondary | ICD-10-CM | POA: Diagnosis not present

## 2023-07-25 DIAGNOSIS — G4733 Obstructive sleep apnea (adult) (pediatric): Secondary | ICD-10-CM | POA: Diagnosis not present

## 2023-08-14 DIAGNOSIS — Z139 Encounter for screening, unspecified: Secondary | ICD-10-CM | POA: Diagnosis not present

## 2023-08-14 DIAGNOSIS — Z1331 Encounter for screening for depression: Secondary | ICD-10-CM | POA: Diagnosis not present

## 2023-08-14 DIAGNOSIS — Z9181 History of falling: Secondary | ICD-10-CM | POA: Diagnosis not present

## 2023-08-14 DIAGNOSIS — Z0000 Encounter for general adult medical examination without abnormal findings: Secondary | ICD-10-CM | POA: Diagnosis not present

## 2023-10-10 DIAGNOSIS — M5430 Sciatica, unspecified side: Secondary | ICD-10-CM | POA: Diagnosis not present

## 2024-01-18 DIAGNOSIS — E118 Type 2 diabetes mellitus with unspecified complications: Secondary | ICD-10-CM | POA: Diagnosis not present

## 2024-01-18 DIAGNOSIS — M109 Gout, unspecified: Secondary | ICD-10-CM | POA: Diagnosis not present

## 2024-01-18 DIAGNOSIS — M179 Osteoarthritis of knee, unspecified: Secondary | ICD-10-CM | POA: Diagnosis not present

## 2024-01-18 DIAGNOSIS — R296 Repeated falls: Secondary | ICD-10-CM | POA: Diagnosis not present

## 2024-01-18 DIAGNOSIS — I10 Essential (primary) hypertension: Secondary | ICD-10-CM | POA: Diagnosis not present

## 2024-01-18 DIAGNOSIS — E782 Mixed hyperlipidemia: Secondary | ICD-10-CM | POA: Diagnosis not present

## 2024-01-18 DIAGNOSIS — N400 Enlarged prostate without lower urinary tract symptoms: Secondary | ICD-10-CM | POA: Diagnosis not present

## 2024-01-18 DIAGNOSIS — G4733 Obstructive sleep apnea (adult) (pediatric): Secondary | ICD-10-CM | POA: Diagnosis not present

## 2024-02-29 DIAGNOSIS — M179 Osteoarthritis of knee, unspecified: Secondary | ICD-10-CM | POA: Diagnosis not present

## 2024-02-29 DIAGNOSIS — I10 Essential (primary) hypertension: Secondary | ICD-10-CM | POA: Diagnosis not present

## 2024-02-29 DIAGNOSIS — I959 Hypotension, unspecified: Secondary | ICD-10-CM | POA: Diagnosis not present

## 2024-02-29 DIAGNOSIS — R296 Repeated falls: Secondary | ICD-10-CM | POA: Diagnosis not present

## 2024-03-10 DIAGNOSIS — I10 Essential (primary) hypertension: Secondary | ICD-10-CM | POA: Diagnosis not present

## 2024-03-10 DIAGNOSIS — M109 Gout, unspecified: Secondary | ICD-10-CM | POA: Diagnosis not present

## 2024-03-10 DIAGNOSIS — M179 Osteoarthritis of knee, unspecified: Secondary | ICD-10-CM | POA: Diagnosis not present

## 2024-06-10 DIAGNOSIS — H6121 Impacted cerumen, right ear: Secondary | ICD-10-CM | POA: Diagnosis not present

## 2024-06-10 DIAGNOSIS — Z23 Encounter for immunization: Secondary | ICD-10-CM | POA: Diagnosis not present

## 2024-06-20 ENCOUNTER — Other Ambulatory Visit: Payer: Self-pay

## 2024-06-20 ENCOUNTER — Emergency Department (HOSPITAL_COMMUNITY): Admission: EM | Admit: 2024-06-20 | Discharge: 2024-06-20 | Discharge status: Against Medical Advice

## 2024-06-20 DIAGNOSIS — Z79899 Other long term (current) drug therapy: Secondary | ICD-10-CM | POA: Insufficient documentation

## 2024-06-20 DIAGNOSIS — S60511A Abrasion of right hand, initial encounter: Secondary | ICD-10-CM | POA: Insufficient documentation

## 2024-06-20 DIAGNOSIS — S6991XA Unspecified injury of right wrist, hand and finger(s), initial encounter: Secondary | ICD-10-CM | POA: Diagnosis present

## 2024-06-20 DIAGNOSIS — I10 Essential (primary) hypertension: Secondary | ICD-10-CM | POA: Insufficient documentation

## 2024-06-20 DIAGNOSIS — Z5329 Procedure and treatment not carried out because of patient's decision for other reasons: Secondary | ICD-10-CM | POA: Diagnosis not present

## 2024-06-20 DIAGNOSIS — Y92410 Unspecified street and highway as the place of occurrence of the external cause: Secondary | ICD-10-CM | POA: Insufficient documentation

## 2024-06-20 DIAGNOSIS — S60512A Abrasion of left hand, initial encounter: Secondary | ICD-10-CM | POA: Diagnosis not present

## 2024-06-20 DIAGNOSIS — S6990XA Unspecified injury of unspecified wrist, hand and finger(s), initial encounter: Secondary | ICD-10-CM | POA: Diagnosis not present
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
## 2024-06-20 NOTE — Discharge Instructions (Signed)
 Please follow-up with your primary doctor soon as possible.  If you change your mind and would like to be evaluated we will gladly see you here in the emergency department.  Or, please return if you develop any new or worsening symptoms please return to the emergency department for evaluation.  He may take over-the-counter medications such as Tylenol  for pain.
